# Patient Record
Sex: Female | Born: 2000 | Hispanic: No | Marital: Single | Smoking: Never smoker
Health system: Southern US, Community
[De-identification: ages and names within clinical notes are randomized; demographics above are authoritative.]

## PROBLEM LIST (undated history)

## (undated) DIAGNOSIS — E559 Vitamin D deficiency, unspecified: Secondary | ICD-10-CM

## (undated) HISTORY — DX: Vitamin D deficiency, unspecified: E55.9

## (undated) HISTORY — PX: MOUTH SURGERY: SHX715

---
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
## 2018-05-31 ENCOUNTER — Ambulatory Visit: Payer: Self-pay | Admitting: *Deleted

## 2018-05-31 NOTE — Telephone Encounter (Signed)
Contacted pt's mother, Dewayne Hatch, regarding her symptoms; she states that she is at work and the pt is at home in bed; Dewayne Hatch states that the pt started feeling nauseous the evening of 05/30/2018; emesis started about 2230 with last episode on 05/31/2018; the pt also had diarrhea which started 05/31/2018 at 0730 (at least 3 episodes); her temp was 102.4 at 0700 (taken with oral digital thermometer); the pt's mother states that her 20 year old sister was sick with the same symptoms yesterday morning but her symptoms have resolved (she only has a fever of 100 now); recommendations made per nurse triage protocol; the pt's mother would like to try recommendations but will call back in the morning to schedule an appointment if the pt is not better; the pt is scheduled to see Maurice Small, [facility], as a new pt on 06/11/2018; will route to office for notification of this encounter.         Reason for Disposition . MILD or MODERATE vomiting (e.g., 1 - 5 times / day)  Answer Assessment - Initial Assessment Questions 1. VOMITING SEVERITY: "How many times have you vomited in the past 24 hours?"     - MILD:  1 - 2 times/day    - MODERATE: 3 - 5 times/day, decreased oral intake without significant weight loss or symptoms of dehydration    - SEVERE: 6 or more times/day, vomits everything or nearly everything, with significant weight loss, symptoms of dehydration      5 times (every hour from 2230 to 0330 2. ONSET: "When did the vomiting begin?"      05/30/2018 at 1030 3. FLUIDS: "What fluids or food have you vomited up today?" "Have you been able to keep any fluids down?"     Mother is not sure 4. ABDOMINAL PAIN: "Are your having any abdominal pain?" If yes : "How bad is it and what does it feel like?" (e.g., crampy, dull, intermittent, constant)      Abdominal cramps 5. DIARRHEA: "Is there any diarrhea?" If so, ask: "How many times today?"      Yes 3 epsisodes 6. CONTACTS: "Is there anyone else in the family with the same  symptoms?"      Yes; pt's sister was also sick on 03/31/2019 (N/V. Temp 101) 7. CAUSE: "What do you think is causing your vomiting?"      8. HYDRATION STATUS: "Any signs of dehydration?" (e.g., dry mouth [not only dry lips], too weak to stand) "When did you last urinate?"     flu 9. OTHER SYMPTOMS: "Do you have any other symptoms?" (e.g., fever, headache, vertigo, vomiting blood or coffee grounds, recent head injury)     Fever 102.4 05/31/2018 at 0700; headache 10. PREGNANCY: "Is there any chance you are pregnant?" "When was your last menstrual period?"       No LMP 05/19/2018  Protocols used: [facility]

## 2018-06-02 ENCOUNTER — Ambulatory Visit: Payer: Self-pay | Admitting: Family Medicine

## 2018-06-11 ENCOUNTER — Ambulatory Visit: Payer: Self-pay | Admitting: Family Medicine

## 2018-06-22 ENCOUNTER — Encounter: Payer: Self-pay | Admitting: Family Medicine

## 2018-06-22 ENCOUNTER — Ambulatory Visit: Payer: Self-pay | Admitting: Family Medicine

## 2018-06-22 ENCOUNTER — Other Ambulatory Visit: Payer: Self-pay

## 2018-06-22 VITALS — BP 130/70 | HR 77 | Temp 98.0°F | Resp 18 | Ht 73.0 in | Wt >= 6400 oz

## 2018-06-22 DIAGNOSIS — Z803 Family history of malignant neoplasm of breast: Secondary | ICD-10-CM

## 2018-06-22 DIAGNOSIS — N898 Other specified noninflammatory disorders of vagina: Secondary | ICD-10-CM

## 2018-06-22 DIAGNOSIS — N946 Dysmenorrhea, unspecified: Secondary | ICD-10-CM

## 2018-06-22 DIAGNOSIS — Z113 Encounter for screening for infections with a predominantly sexual mode of transmission: Secondary | ICD-10-CM

## 2018-06-22 DIAGNOSIS — Z30011 Encounter for initial prescription of contraceptive pills: Secondary | ICD-10-CM

## 2018-06-22 MED ORDER — NORGESTIM-ETH ESTRAD TRIPHASIC 0.18/0.215/0.25 MG-25 MCG PO TABS: 1.0000 | ORAL_TABLET | Freq: Every day | ORAL | 3 refills | Status: DC

## 2018-06-22 NOTE — Patient Instructions (Signed)
Start your birth control pack on the 7th day of your period or the Sunday after your period starts.  Take Aleve 220mg  twice daily 2 days before starting your period, and for at least 2-3 days during your period.

## 2018-06-22 NOTE — Progress Notes (Signed)
Name: Patricia Dickerson   MRN: [id number]    DOB: 07-Dec-2000   Date:06/22/2018       Progress Note  Subjective  Chief Complaint  Chief Complaint  Patient presents with  . Establish Care  . Contraception    HPI  Pt presents to establish care and for the following:  Contraception/Dysmenorrhea: she has heavy periods, periods are very heavy x2 days, heavy cramping.  She is concerned about avoiding weight gain. She does endorse foul/metallic smelling vaginal odor for about a year.  She denies abdominal pain, NVD.  She is morbidly obese but denies hx prediabetes - denies polyphagia, polydipsia, polyuria, dysuria.   Obesity: She has been to a nutritionist in the past and still skips breakfast.  She sleeps very late and doesn't want to eat. Doesn't eat sweets, does drink soda and Patricia tea.  Struggles with portions. Denies depression or anxiety history.  She has trouble with binge eating pasta.  She is not exercising.   There are no active problems to display for this patient.   Past Surgical History:  Procedure Laterality Date  . MOUTH SURGERY      Family History  Problem Relation Age of Onset  . Thyroid disease Mother   . Diabetes Father     Social History   Socioeconomic History  . Marital status: Single    Spouse name: Not on file  . Number of children: 0  . Years of education: Not on file  . Highest education level: Not on file  Occupational History  . Not on file  Social Needs  . Financial resource strain: Not hard at all  . Food insecurity:    Worry: Never true    Inability: Never true  . Transportation needs:    Medical: No    Non-medical: No  Tobacco Use  . Smoking status: Never Smoker  . Smokeless tobacco: Never Used  Substance and Sexual Activity  . Alcohol use: Never    Frequency: Never  . Drug use: Never  . Sexual activity: Never    Partners: Male  Lifestyle  . Physical activity:    Days per week: 0 days    Minutes per session: 0 min  . Stress:  Not on file  Relationships  . Social connections:    Talks on phone: More than three times a week    Gets together: More than three times a week    Attends religious service: Never    Active member of club or organization: No    Attends meetings of clubs or organizations: Never    Relationship status: Never married  . Intimate partner violence:    Fear of current or ex partner: No    Emotionally abused: Not on file    Physically abused: No    Forced sexual activity: No  Other Topics Concern  . Not on file  Social History Narrative  . Not on file     Current Outpatient Medications:  .  Ascorbic Acid (VITAMIN C) 100 MG tablet, Take 100 mg by mouth daily., Disp: , Rfl:  .  omeprazole (PRILOSEC) 20 MG capsule, Take by mouth., Disp: , Rfl:  .  Norgestimate-Ethinyl Estradiol Triphasic (ORTHO TRI-CYCLEN LO) 0.18/0.215/0.25 MG-25 MCG tab, Take 1 tablet by mouth daily., Disp: 3 Package, Rfl: 3  Allergies  Allergen Reactions  . Amoxicillin Hives and Rash    I personally reviewed active problem list, medication list, allergies, family history, social history, health maintenance, notes from last encounter,  lab results with the patient/caregiver today.   ROS  Ten systems reviewed and is negative except as mentioned in HPI  Objective  Vitals:   06/22/18 1328  BP: 130/70  Pulse: 77  Resp: 18  Temp: 98 F (36.7 C)  TempSrc: Oral  SpO2: 97%  Weight: (!) 451 lb 11.2 oz (204.9 kg)  Height: 6\' 1"  (1.854 m)    Body mass index is 59.59 kg/m.  Physical Exam  Constitutional: Patient appears well-developed and well-nourished. No distress.  HENT: Head: Normocephalic and atraumatic.  Cardiovascular: Normal rate, regular rhythm and normal heart sounds.  No murmur heard. No BLE edema. Pulmonary/Chest: Effort normal and breath sounds normal. No respiratory distress. Breast: no lumps or masses, no nipple discharge or rashes FEMALE GENITALIA: Pt deferred. Musculoskeletal: Normal range  of motion, no joint effusions. No gross deformities Neurological: he is alert and oriented to person, place, and time. No cranial nerve deficit. Coordination, balance, strength, speech and gait are normal.  Skin: Skin is warm and dry. No rash noted. No erythema.  Psychiatric: Patient has a normal mood and affect. behavior is normal. Judgment and thought content normal.  No results found for this or any previous visit (from the past 72 hour(s)).   PHQ2/9: Depression screen PHQ 2/9 06/22/2018  Decreased Interest 0  Down, Depressed, Hopeless 0  PHQ - 2 Score 0  Altered sleeping 0  Tired, decreased energy 0  Change in appetite 0  Feeling bad or failure about yourself  0  Trouble concentrating 0  Moving slowly or fidgety/restless 0  Suicidal thoughts 0  PHQ-9 Score 0  Difficult doing work/chores Not difficult at all   Fall Risk: Fall Risk  06/22/2018  Falls in the past year? 1  Number falls in past yr: 0  Injury with Fall? 1  Follow up Falls evaluation completed    Assessment & Plan  1. Dysmenorrhea in adolescent - Norgestimate-Ethinyl Estradiol Triphasic (ORTHO TRI-CYCLEN LO) 0.18/0.215/0.25 MG-25 MCG tab; Take 1 tablet by mouth daily.  Dispense: 3 Package; Refill: 3 - CBC w/Diff/Platelet  2. Family history of breast cancer - No concerns today  3. Vaginal odor - Patient refuses vaginal swab after history is taken. Advised to return if she would like evaluation. - Cervicovaginal ancillary only  4. Morbid obesity (HCC) - Discussed importance of 150 minutes of physical activity weekly, eat two servings of fish weekly, eat one serving of tree nuts ( cashews, pistachios, pecans, almonds.Marland Kitchen) every other day, eat 6 servings of fruit/vegetables daily and drink plenty of water and avoid Patricia beverages. ' - COMPLETE METABOLIC PANEL WITH GFR - Hemoglobin A1c  5. Oral contraception initial prescription - Norgestimate-Ethinyl Estradiol Triphasic (ORTHO TRI-CYCLEN LO) 0.18/0.215/0.25  MG-25 MCG tab; Take 1 tablet by mouth daily.  Dispense: 3 Package; Refill: 3  6. Routine screening for STI (sexually transmitted infection) - Cervicovaginal ancillary only - HIV Antibody (routine testing w rflx) - RPR

## 2018-06-23 LAB — CBC WITH DIFFERENTIAL/PLATELET
Absolute Monocytes: 533 cells/uL (ref 200–950)
Basophils Absolute: 41 cells/uL (ref 0–200)
Basophils Relative: 0.5 %
Eosinophils Absolute: 74 cells/uL (ref 15–500)
Eosinophils Relative: 0.9 %
HCT: 43 % (ref 35.0–45.0)
Hemoglobin: 14.3 g/dL (ref 11.7–15.5)
Lymphs Abs: 2960 cells/uL (ref 850–3900)
MCH: 29.3 pg (ref 27.0–33.0)
MCHC: 33.3 g/dL (ref 32.0–36.0)
MCV: 88.1 fL (ref 80.0–100.0)
MPV: 11.2 fL (ref 7.5–12.5)
Monocytes Relative: 6.5 %
NEUTROS PCT: 56 %
Neutro Abs: 4592 cells/uL (ref 1500–7800)
Platelets: 379 10*3/uL (ref 140–400)
RBC: 4.88 10*6/uL (ref 3.80–5.10)
RDW: 12.2 % (ref 11.0–15.0)
Total Lymphocyte: 36.1 %
WBC: 8.2 10*3/uL (ref 3.8–10.8)

## 2018-06-23 LAB — HEMOGLOBIN A1C
Hgb A1c MFr Bld: 5.5 % of total Hgb (ref <5.7)
Mean Plasma Glucose: 111 (calc)
eAG (mmol/L): 6.2 (calc)

## 2018-06-23 LAB — COMPLETE METABOLIC PANEL WITH GFR
AG Ratio: 1.6 (calc) (ref 1.0–2.5)
ALT: 15 U/L (ref 5–32)
AST: 14 U/L (ref 12–32)
Albumin: 4.3 g/dL (ref 3.6–5.1)
Alkaline phosphatase (APISO): 53 U/L (ref 47–176)
BUN: 13 mg/dL (ref 7–20)
CALCIUM: 9.6 mg/dL (ref 8.9–10.4)
CO2: 24 mmol/L (ref 20–32)
Chloride: 106 mmol/L (ref 98–110)
Creat: 0.79 mg/dL (ref 0.50–1.00)
GFR, Est African American: 126 mL/min/{1.73_m2} (ref >=60)
GFR, Est Non African American: 109 mL/min/{1.73_m2} (ref >=60)
Globulin: 2.7 g/dL (calc) (ref 2.0–3.8)
Glucose, Bld: 87 mg/dL (ref 65–99)
Potassium: 4.5 mmol/L (ref 3.8–5.1)
Sodium: 139 mmol/L (ref 135–146)
Total Bilirubin: 0.6 mg/dL (ref 0.2–1.1)
Total Protein: 7 g/dL (ref 6.3–8.2)

## 2018-06-23 LAB — RPR: RPR Ser Ql: NONREACTIVE

## 2018-06-23 LAB — HIV ANTIBODY (ROUTINE TESTING W REFLEX): HIV 1&2 Ab, 4th Generation: NONREACTIVE

## 2018-07-01 ENCOUNTER — Encounter: Payer: Self-pay | Admitting: Family Medicine

## 2018-07-02 ENCOUNTER — Telehealth: Payer: Self-pay | Admitting: Emergency Medicine

## 2018-07-02 MED ORDER — NALTREXONE-BUPROPION HCL ER 8-90 MG PO TB12: ORAL_TABLET | ORAL | 2 refills | Status: DC

## 2018-07-02 NOTE — Telephone Encounter (Signed)
Called and spoke to mom who is on the DPR about daughters bill explaining that we never know how much a selfpay bill will be until after the visit and it gets billed. Mom was upset and only states that "it is what it is".

## 2018-07-02 NOTE — Telephone Encounter (Signed)
Copied from CRM [id number]. Topic: General - Inquiry >> Jul 02, 2018  8:24 AM Windy Kalata, NT wrote: Reason for CRM: patient mother is calling and states that on the new patient visit she paid $50 upfront and was told that the total would be around $80. She states that they received a bill for $180 and she is wanting to know why she was charged so much when it was a new patient appointment and all they did was talk. Please advise

## 2018-07-06 ENCOUNTER — Encounter: Payer: Self-pay | Admitting: Family Medicine

## 2018-07-26 ENCOUNTER — Other Ambulatory Visit: Payer: Self-pay | Admitting: Family Medicine

## 2018-07-26 NOTE — Telephone Encounter (Signed)
Copied from CRM [id number]. Topic: Quick Communication - Rx Refill/Question >> Jul 26, 2018  4:12 PM Jaquita Rector A wrote: Medication: Naltrexone-buPROPion HCl ER 8-90 MG TB12   Rx never received originally sent to wrong pharmacy  Has the patient contacted their pharmacy? Yes.   (Agent: If no, request that the patient contact the pharmacy for the refill.) (Agent: If yes, when and what did the pharmacy advise?)  Preferred Pharmacy (with phone number or street name): [facility] [address] - [place], [place] - [address] [phone] (Phone) [phone] (Fax)    Agent: Please be advised that RX refills may take up to 3 business days. We ask that you follow-up with your pharmacy.

## 2018-07-27 NOTE — Telephone Encounter (Signed)
Refill is too soon

## 2018-07-27 NOTE — Telephone Encounter (Signed)
Refill request for general medication: Naltrexone-Bupropion HCI Er 8-90 mg  Last office visit: 06/22/2018  Last physical exam: None indicated  Follow-ups on file. 10/21/2018

## 2018-10-04 ENCOUNTER — Ambulatory Visit (INDEPENDENT_AMBULATORY_CARE_PROVIDER_SITE_OTHER): Payer: Medicaid Other | Admitting: Family Medicine

## 2018-10-04 ENCOUNTER — Encounter: Payer: Self-pay | Admitting: Family Medicine

## 2018-10-04 ENCOUNTER — Other Ambulatory Visit: Payer: Self-pay

## 2018-10-04 DIAGNOSIS — F419 Anxiety disorder, unspecified: Secondary | ICD-10-CM | POA: Insufficient documentation

## 2018-10-04 DIAGNOSIS — Z68.41 Body mass index (BMI) pediatric, greater than or equal to 95th percentile for age: Secondary | ICD-10-CM | POA: Diagnosis not present

## 2018-10-04 DIAGNOSIS — R51 Headache: Secondary | ICD-10-CM

## 2018-10-04 DIAGNOSIS — R519 Headache, unspecified: Secondary | ICD-10-CM

## 2018-10-04 DIAGNOSIS — N946 Dysmenorrhea, unspecified: Secondary | ICD-10-CM | POA: Diagnosis not present

## 2018-10-04 MED ORDER — BUSPIRONE HCL 7.5 MG PO TABS: ORAL_TABLET | ORAL | 1 refills | Status: AC

## 2018-10-04 NOTE — Patient Instructions (Signed)
Headache Tips:  Keep a headache log: Record the date and time your headaches start and end, the severity of your pain (0-10), any possible triggers, what treatments you tried, and if any of those treatments worked.  Common Headache Triggers: Certain food additives, alcohol, artificial sweeteners (like aspartame), caffeine (overuse and when you stop suddenly), delayed or skipped meals, exercise, certain foods (chocolate, soft cheeses, red wine), bright lights, loud noises, menses, certain odors, oral contraceptives, depression and anxiety, sleep issues, smoke inhalation (smoking or second-hand smoke), stress, weather changes  Supplements known to reduce headaches and migraines are: Riboflavin - Vitamin B2 - goal is 200mg/day twice per day with food Magnesium - goal is 200mg twice per day with food OR MigreLief - 1 tablet twice per day with food. This is a combination of the Riboflavin and Magnesium. It is only available at some health food stores and from [employer].   Please work to reduce your stress by taking at least 30 minutes to exercise, stretch, and/or meditate every day.  Drink plenty of water throughout the day, do not skip meals, and eat healthy foods that are not fried, fatty, high in sugar, or processed.  

## 2018-10-04 NOTE — Progress Notes (Signed)
Name: Patricia Dickerson   MRN: [id number]    DOB: 2000-06-01   Date:10/04/2018       Progress Note  Subjective  Chief Complaint  Chief Complaint  Patient presents with  . Follow-up  . Anxiety  . Pressure Behind the Eyes    for 1 week    I connected with  [name]  on 10/04/18 at  1:00 PM EDT by a video enabled telemedicine application and verified that I am speaking with the correct person using two identifiers.  I discussed the limitations of evaluation and management by telemedicine and the availability of in person appointments. The patient expressed understanding and agreed to proceed. Staff also discussed with the patient that there may be a patient responsible charge related to this service. Patient Location: Home Provider Location: Home Additional Individuals present: Mother  HPI  Contraception/Dysmenorrhea: she has heavy periods, periods are very heavy x2 days, heavy cramping. Had discussed risk of clotting due to obesity when taking OCP's at last visit and she agreed to start OCP, however she never did start the medication.  At this time, we will hold off on initiation as she has been having new onset headaches.  Obesity: She has been to a nutritionist in the past and still skips breakfast.  She sleeps very late and doesn't want to eat. Doesn't eat sweets, does drink soda and sweet tea.  Struggles with portions. Denies depression or anxiety history.  She has trouble with binge eating pasta.  She is not exercising. Contrave was denied - we will look into appeals process.   Headaches: She rarely has headaches, lately she has been getting posterior headache and some pressure behind the eyes.  Denies vision changes, did have eye appt in February 2020, but did have pressure test done and this was normal. Denies n/v, light/sound sensitivity.  She has been dealing with more stress and anxiety lately- headache started at the same time as her anxiety. She is drinking sodas (about 1  a day) and is eating less than usual.  She has been taking ibuprofen when she absolutely needs to,   Anxiety: She has taken an antidepressant in the past, after looking at the SE's she decided she didn't want to take the medication on a daily basis.  She does not want a daily medication, but something more as needed and had been recommended a benzodiazepine and would like something like this again.  She does get panic attacks - occurring approximately once a week.  She is not working right now, having difficulty sleeping, recently went through a break up, not being about to leave the house. We will trial buspar by itself today.      [facility] Visit from 10/04/2018 in [facility]  PHQ-9 Total Score  4     GAD 7 : Generalized Anxiety Score 10/04/2018  Nervous, Anxious, on Edge 2  Control/stop worrying 0  Worry too much - different things 2  Trouble relaxing 0  Restless 0  Easily annoyed or irritable 0  Afraid - awful might happen 0  Total GAD 7 Score 4  Anxiety Difficulty Somewhat difficult   Patient Active Problem List   Diagnosis Date Noted  . Severe obesity due to excess calories without serious comorbidity with body mass index (BMI) greater than 99th percentile for age in pediatric patient (HCC) 11/10/2016  . Headache in front of head 01/12/2014  . Gastroesophageal reflux 12/26/2013    Past Surgical History:  Procedure  Laterality Date  . MOUTH SURGERY      Family History  Problem Relation Age of Onset  . Thyroid disease Mother   . Diabetes Father     Social History   Socioeconomic History  . Marital status: Single    Spouse name: Not on file  . Number of children: 0  . Years of education: Not on file  . Highest education level: Not on file  Occupational History  . Not on file  Social Needs  . Financial resource strain: Not hard at all  . Food insecurity:    Worry: Never true    Inability: Never true  . Transportation needs:    Medical: No     Non-medical: No  Tobacco Use  . Smoking status: Never Smoker  . Smokeless tobacco: Never Used  Substance and Sexual Activity  . Alcohol use: Never    Frequency: Never  . Drug use: Never  . Sexual activity: Never    Partners: Male  Lifestyle  . Physical activity:    Days per week: 0 days    Minutes per session: 0 min  . Stress: Not on file  Relationships  . Social connections:    Talks on phone: More than three times a week    Gets together: More than three times a week    Attends religious service: Never    Active member of club or organization: No    Attends meetings of clubs or organizations: Never    Relationship status: Never married  . Intimate partner violence:    Fear of current or ex partner: No    Emotionally abused: Not on file    Physically abused: No    Forced sexual activity: No  Other Topics Concern  . Not on file  Social History Narrative  . Not on file     Current Outpatient Medications:  .  Ascorbic Acid (VITAMIN C) 100 MG tablet, Take 100 mg by mouth daily., Disp: , Rfl:  .  Norgestimate-Ethinyl Estradiol Triphasic (ORTHO TRI-CYCLEN LO) 0.18/0.215/0.25 MG-25 MCG tab, Take 1 tablet by mouth daily., Disp: 3 Package, Rfl: 3 .  omeprazole (PRILOSEC) 20 MG capsule, Take by mouth., Disp: , Rfl:  .  Naltrexone-buPROPion HCl ER 8-90 MG TB12, 1 tablet each morning x7 days, then 1 tablet twice daily x7 days, then 2 tablets each morning and 1 tablet each evening x7 days, then maintain at 2 tablets twice daily. (Patient not taking: Reported on 10/04/2018), Disp: 120 tablet, Rfl: 2  Allergies  Allergen Reactions  . Amoxicillin Hives and Rash    I personally reviewed active problem list, medication list, allergies, family history, notes from last encounter, lab results with the patient/caregiver today.   ROS Constitutional: Negative for fever or weight change.  Respiratory: Negative for cough and shortness of breath.   Cardiovascular: Negative for chest pain or  palpitations.  Gastrointestinal: Negative for abdominal pain, no bowel changes.  Musculoskeletal: Negative for gait problem or joint swelling.  Skin: Negative for rash.  Neurological: Negative for dizziness; positive for headache.  No other specific complaints in a complete review of systems (except as listed in HPI above).   Objective  Virtual encounter, vitals not obtained.  There is no height or weight on file to calculate BMI.  Physical Exam Constitutional: Patient appears well-developed and well-nourished. No distress.  HENT: Head: Normocephalic and atraumatic.  Neck: Normal range of motion. Pulmonary/Chest: Effort normal. No respiratory distress. Speaking in complete sentences Neurological: Pt is alert  and oriented to person, place, and time. Coordination, speech and gait are normal.  Psychiatric: Patient has a normal mood and affect. behavior is normal. Judgment and thought content normal.    No results found for this or any previous visit (from the past 72 hour(s)).  PHQ2/9: Depression screen [facility] 10/04/2018 06/22/2018  Decreased Interest 0 0  Down, Depressed, Hopeless 0 0  PHQ - 2 Score 0 0  Altered sleeping 3 0  Tired, decreased energy 1 0  Change in appetite 0 0  Feeling bad or failure about yourself  0 0  Trouble concentrating 0 0  Moving slowly or fidgety/restless 0 0  Suicidal thoughts 0 0  PHQ-9 Score 4 0  Difficult doing work/chores Not difficult at all Not difficult at all   PHQ-2/9 Result is positive.    Fall Risk: Fall Risk  10/04/2018 06/22/2018  Falls in the past year? 0 1  Number falls in past yr: 0 0  Injury with Fall? 0 1  Follow up Falls evaluation completed Falls evaluation completed    Assessment & Plan  1. Anxiety - Discussed stress reduction - busPIRone (BUSPAR) 7.5 MG tablet; Take 1 tablet once daily at night, may take 1 add'l tab during the day as needed for anxiety.  Dispense: 45 tablet; Refill: 1  2. Headache in front of head -  Discussed headache diet, keeping a log of headaches, and stress reduction.  Will recheck in 4 weeks.  3. Severe obesity due to excess calories without serious comorbidity with body mass index (BMI) greater than 99th percentile for age in pediatric patient [facility](HCC) - Discussed importance of 150 minutes of physical activity weekly, eat two servings of fish weekly, eat one serving of tree nuts ( cashews, pistachios, pecans, almonds.Marland Kitchen.) every other day, eat 6 servings of fruit/vegetables daily and drink plenty of water and avoid sweet beverages.   4. Dysmenorrhea in adolescent - Do not take OCP - she never started and is now having new onset headaches, so we will hold off on initiation at this time.   I discussed the assessment and treatment plan with the patient. The patient was provided an opportunity to ask questions and all were answered. The patient agreed with the plan and demonstrated an understanding of the instructions.  The patient was advised to call back or seek an in-person evaluation if the symptoms worsen or if the condition fails to improve as anticipated.  I provided 26 minutes of non-face-to-face time during this encounter.

## 2018-10-14 ENCOUNTER — Ambulatory Visit (INDEPENDENT_AMBULATORY_CARE_PROVIDER_SITE_OTHER): Payer: Medicaid Other | Admitting: Family Medicine

## 2018-10-14 ENCOUNTER — Other Ambulatory Visit: Payer: Self-pay

## 2018-10-14 ENCOUNTER — Encounter: Payer: Self-pay | Admitting: Family Medicine

## 2018-10-14 VITALS — BP 118/74 | HR 95 | Temp 99.0°F | Resp 12 | Ht 72.0 in | Wt >= 6400 oz

## 2018-10-14 DIAGNOSIS — H6981 Other specified disorders of Eustachian tube, right ear: Secondary | ICD-10-CM | POA: Diagnosis not present

## 2018-10-14 DIAGNOSIS — H6121 Impacted cerumen, right ear: Secondary | ICD-10-CM | POA: Diagnosis not present

## 2018-10-14 MED ORDER — FLUTICASONE PROPIONATE 50 MCG/ACT NA SUSP: 2.0000 | Freq: Every day | NASAL | 6 refills | Status: DC

## 2018-10-14 MED ORDER — CARBAMIDE PEROXIDE 6.5 % OT SOLN: 5.0000 [drp] | Freq: Two times a day (BID) | OTIC | 3 refills | Status: DC | PRN

## 2018-10-14 NOTE — Progress Notes (Signed)
Name: Patricia Dickerson   MRN: [id number]    DOB: 08/08/2000   Date:10/14/2018       Progress Note  Subjective  Chief Complaint  Chief Complaint  Patient presents with  . Ear Problem    right ear is muffled no other symptoms    HPI  With concern for RIGHT ear feeling muffled.  She has struggled with this in the past, has had tubes in the past.  Symptom onset 2 weeks ago, and is improving on its own. No fevers, chills, otalgia, nasal congestion, cough, chest tightness, or shortness of breath, no loss of taste or smell, no tinnitus.  Patient Active Problem List   Diagnosis Date Noted  . Anxiety 10/04/2018  . Severe obesity due to excess calories without serious comorbidity with body mass index (BMI) greater than 99th percentile for age in pediatric patient (HCC) 11/10/2016  . Headache in front of head 01/12/2014  . Gastroesophageal reflux 12/26/2013    Social History   Tobacco Use  . Smoking status: Never Smoker  . Smokeless tobacco: Never Used  Substance Use Topics  . Alcohol use: Never    Frequency: Never     Current Outpatient Medications:  .  Ascorbic Acid (VITAMIN C) 100 MG tablet, Take 100 mg by mouth daily., Disp: , Rfl:  .  busPIRone (BUSPAR) 7.5 MG tablet, Take 1 tablet once daily at night, may take 1 add'l tab during the day as needed for anxiety., Disp: 45 tablet, Rfl: 1 .  omeprazole (PRILOSEC) 20 MG capsule, Take by mouth., Disp: , Rfl:   Allergies  Allergen Reactions  . Amoxicillin Hives and Rash    I personally reviewed active problem list, medication list, allergies, lab results with the patient/caregiver today.  ROS  Ten systems reviewed and is negative except as mentioned in HPI.  Objective  Vitals:   10/14/18 1013  BP: 118/74  Pulse: 95  Resp: 12  Temp: 99 F (37.2 C)  TempSrc: Oral  SpO2: 96%  Weight: (!) 447 lb 9.6 oz (203 kg)  Height: 6' (1.829 m)   Body mass index is 60.71 kg/m.  Nursing Note and Vital Signs reviewed.   Physical Exam  Constitutional: Patient appears well-developed and well-nourished. Obese. No distress.  HEENT: head atraumatic, normocephalic, pupils equal and reactive to light, Bilateral TM's without erythema or effusion - there is small amount of cerumen in the RIGHT canal without impaction, bilataeral TM's with some scarring (states hx tubes),  bilateral maxillary and frontal sinuses are non-tender, neck supple without lymphadenopathy, throat within normal limits - no erythema or exudate, no tonsillar swelling Cardiovascular: Normal rate, regular rhythm and normal heart sounds.  No murmur heard. No BLE edema. Pulmonary/Chest: Effort normal and breath sounds clear bilaterally. No respiratory distress. Psychiatric: Patient has a normal mood and affect. behavior is normal. Judgment and thought content normal.  No results found for this or any previous visit (from the past 72 hour(s)).  Assessment & Plan  1. Cerumen debris on tympanic membrane of right ear - carbamide peroxide (DEBROX) 6.5 % OTIC solution; Place 5 drops into both ears 2 (two) times daily as needed.  Dispense: 15 mL; Refill: 3  2. Dysfunction of right eustachian tube - fluticasone (FLONASE) 50 MCG/ACT nasal spray; Place 2 sprays into both nostrils daily.  Dispense: 16 g; Refill: 6  -Red flags and when to present for emergency care or RTC including fever >101.72F, chest pain, shortness of breath, new/worsening/un-resolving symptoms, reviewed with patient at  time of visit. Follow up and care instructions discussed and provided in AVS.

## 2018-10-21 ENCOUNTER — Encounter: Payer: Medicaid Other | Admitting: Family Medicine

## 2018-10-21 ENCOUNTER — Ambulatory Visit: Payer: Self-pay | Admitting: *Deleted

## 2018-10-21 NOTE — Telephone Encounter (Signed)
Spoke with mother directly, advised to follow RN guidance as below.  Check temperatures daily.  Strongly recommended they start following the guidelines set forth by CDC regarding social distancing and masking, do their best to self-isolate over the next 14 days and monitor for symptoms.

## 2018-10-21 NOTE — Telephone Encounter (Signed)
Pt's mother, Patricia Dickerson, called stating that the pt had been exposed to a friend whose parents tested positive for COVID on 10/18/2018; the family visited [place] and [place], [place]; 10/15/2018 - 10/18/2018; she would like to know what to do, and asks about testing; recommendations made per nurse triage protocol; pt's mother transferred to [facility] at [facility] for conversation with provider.  Reason for Disposition . [1] COVID-19 EXPOSURE (Close Contact) within last 14 days AND [2] NO cough, fever, or breathing difficulty  Answer Assessment - Initial Assessment Questions 1. CLOSE CONTACT: "Who is the person with the confirmed or suspected COVID-19 infection that you were exposed to?"     Pt's friend 2. PLACE of CONTACT: "Where were you when you were exposed to COVID-19?" (e.g., home, school, medical waiting room; which city?)     home 3. TYPE of CONTACT: "How much contact was there?" (e.g., sitting next to, live in same house, work in same office, same building)   Home and shopping 4. DURATION of CONTACT: "How long were you in contact with the COVID-19 patient?" (e.g., a few seconds, passed by person, a few minutes, live with the patient)     hours 5. DATE of CONTACT: "When did you have contact with a COVID-19 patient?" (e.g., how many days ago)     Oct 18, 2018 6. TRAVEL: "Have you traveled out of the country recently?" If so, "When and where?"     * Also ask about out-of-state travel, since the CDC has identified some high risk cities for community spread in the [place].     * Note: Travel becomes less relevant if there is widespread community transmission where the patient lives.     Yes, [place] 10/15/18-10/18/18 7. COMMUNITY SPREAD: "Are there lots of cases of COVID-19 (community spread) where you live?" (See public health department website, if unsure)   * MAJOR community spread: high number of cases; numbers of cases are increasing; many people hospitalized.   * MINOR community spread:  low number of cases; not increasing; few or no people hospitalized     major 8. SYMPTOMS: "Do you have any symptoms?" (e.g., fever, cough, breathing difficulty)    no 9. PREGNANCY OR POSTPARTUM: "Is there any chance you are pregnant?" "When was your last menstrual period?" "Did you deliver in the last 2 weeks?"    No; LMP 10/15/18 10. HIGH RISK: "Do you have any heart or lung problems? Do you have a weak immune system?" (e.g., CHF, COPD, asthma, HIV positive, chemotherapy, renal failure, diabetes mellitus, sickle cell anemia)     no  Protocols used: CORONAVIRUS (COVID-19) EXPOSURE-A-AH

## 2018-10-21 NOTE — Telephone Encounter (Signed)
FYI

## 2018-11-02 ENCOUNTER — Ambulatory Visit: Payer: Medicaid Other | Admitting: Family Medicine

## 2018-11-04 ENCOUNTER — Encounter: Payer: Self-pay | Admitting: Family Medicine

## 2018-11-04 ENCOUNTER — Other Ambulatory Visit: Payer: Self-pay

## 2018-11-04 ENCOUNTER — Ambulatory Visit (INDEPENDENT_AMBULATORY_CARE_PROVIDER_SITE_OTHER): Payer: Medicaid Other | Admitting: Family Medicine

## 2018-11-04 VITALS — BP 118/74 | HR 100 | Resp 16 | Ht 72.0 in | Wt >= 6400 oz

## 2018-11-04 DIAGNOSIS — Z0000 Encounter for general adult medical examination without abnormal findings: Secondary | ICD-10-CM | POA: Diagnosis not present

## 2018-11-04 NOTE — Progress Notes (Signed)
Adolescent Well Care Visit Patricia Dickerson is a 18 y.o. female who is here for well care.     PCP:  Doren CustardBoyce, Emily E, FNP   History was provided by the patient.  Confidentiality was discussed with the patient and, if applicable, with caregiver as well. Patient's personal or confidential phone number: On file    Current Issues: Current concerns include No concerns   Nutrition: Nutrition/Eating Behaviors: Does like to eat pasta, eats a lot of vegetables but not as much fruit, eats a lot of chicken - baked and grilled mostly. Adequate calcium in diet?: Does not get much in her diet - encouraged her to increase this. Supplements/ Vitamins: Taking vitamin C.   Exercise/ Media: Play any Sports?:  None Exercise:  none and not active Screen Time:  > 2 hours-counseling provided Media Rules or Monitoring?: no  Sleep:  Sleep: Getting about 7-8 hours a night  Social Screening: Lives with:  Mom, and two younger siblings Parental relations:  good Activities, Work, and Regulatory affairs officerChores?: She is working at [employer].  Has responsibilities at home. No activity involvement. Concerns regarding behavior with peers?  no Stressors of note: no  Education: School Name: Planning to attend [employer] for the fall. Hoping to study nursing School Grade: N/A - finished high school. School performance: No learning disabilities  Menstruation:   Patient's last menstrual period was 10/16/2018 (exact date). Menstrual History: Has ongoing heavy menses.  She notes they are irregular; used to track but stopped a while ago.  She was given OCP a few months ago, but she never took it.   Patient has a dental home: no - due to recent move  Confidential social history: Tobacco?  no Secondhand smoke exposure?  no Drugs/ETOH?  no  Sexually Active?  no - has never been sexually active; ID's as heterosexual. Pregnancy Prevention: Declines OCP, discussed condom use.   Safe at home, in school & in relationships?  Yes Safe to  self?  Yes   PHQ-9 completed and results indicated.   [facility]  PHQ-9 Total Score  0     Physical Exam:  Vitals:   11/04/18 1006  BP: 118/74  Pulse: 100  Resp: 16  SpO2: 99%  Weight: (!) 452 lb 11.2 oz (205.3 kg)  Height: 6' (1.829 m)   BP 118/74 (BP Location: Left Arm, Patient Position: Sitting, Cuff Size: Large)   Pulse 100   Resp 16   Ht 6' (1.829 m)   Wt (!) 452 lb 11.2 oz (205.3 kg)   LMP 10/16/2018 (Exact Date)   SpO2 99%   BMI 61.40 kg/m  Body mass index: body mass index is 61.4 kg/m. Growth percentile SmartLinks can only be used for patients less than 20 years old.  No exam data present  Physical Exam  Constitutional: Patient appears well-developed and well-nourished. No distress.  HENT: Head: Normocephalic and atraumatic. Ears: B TMs ok, no erythema or effusion; Nose: Nose normal. Mouth/Throat: Oropharynx is clear and moist. No oropharyngeal exudate.  Eyes: Conjunctivae and EOM are normal. Pupils are equal, round, and reactive to light. No scleral icterus.  Neck: Normal range of motion. Neck supple. No JVD present. No thyromegaly present.  Cardiovascular: Normal rate, regular rhythm and normal heart sounds.  No murmur heard. No BLE edema. Pulmonary/Chest: Effort normal and breath sounds normal. No respiratory distress. Abdominal: Soft. Bowel sounds are normal, no distension. There is no tenderness. no masses Breast: no lumps or  masses, no nipple discharge or rashes FEMALE GENITALIA: Deferred Musculoskeletal: Normal range of motion, no joint effusions. No gross deformities Neurological: he is alert and oriented to person, place, and time. No cranial nerve deficit. Coordination, balance, strength, speech and gait are normal.  Skin: Skin is warm and dry. No rash noted. No erythema.  Psychiatric: Patient has a normal mood and affect. behavior is normal. Judgment and thought content normal.  Assessment and Plan:    1. Well woman exam (no gynecological exam) Discussed with adolescent  and caregiver the importance of limiting screen time to no more than 2 hours per day, exercise daily for at least 2 hours, eat 6 servings of fruit and vegetables daily, eat tree nuts ( pistachios, pecans , almonds...) one serving every other day, eat fish twice weekly. Read daily. Get involved in school. Have responsibilities  at home. To avoid STI's, practice abstinence, if unable use condoms and stick with one partner.  Discussed importance of contraception if sexually active to avoid unwanted pregnancy.  BMI is not appropriate for age - Doing well, labs at last visit, declines vaginal swab.  Hubbard Hartshorn, FNP

## 2018-12-02 ENCOUNTER — Ambulatory Visit: Payer: Medicaid Other | Admitting: Family Medicine

## 2018-12-07 DIAGNOSIS — J029 Acute pharyngitis, unspecified: Secondary | ICD-10-CM | POA: Diagnosis not present

## 2018-12-07 DIAGNOSIS — Z20828 Contact with and (suspected) exposure to other viral communicable diseases: Secondary | ICD-10-CM | POA: Diagnosis not present

## 2018-12-16 ENCOUNTER — Encounter: Payer: Self-pay | Admitting: Nurse Practitioner

## 2018-12-16 ENCOUNTER — Ambulatory Visit (INDEPENDENT_AMBULATORY_CARE_PROVIDER_SITE_OTHER): Payer: Medicaid Other | Admitting: Nurse Practitioner

## 2018-12-16 DIAGNOSIS — Z8669 Personal history of other diseases of the nervous system and sense organs: Secondary | ICD-10-CM

## 2018-12-16 DIAGNOSIS — H9201 Otalgia, right ear: Secondary | ICD-10-CM | POA: Diagnosis not present

## 2018-12-16 NOTE — Progress Notes (Signed)
Virtual Visit via Video Note  I connected with Patricia Dickerson on 12/16/18 at 10:00 AM EDT by a video enabled telemedicine application and verified that I am speaking with the correct person using two identifiers.   Staff discussed the limitations of evaluation and management by telemedicine and the availability of in person appointments. The patient expressed understanding and agreed to proceed.  Patient location: home  My location: home office Other people present: mom- Loyal Jacobsonnn Pettinger HPI  Right ear has muffled noises for the past few days and has pressure in ear.   States in the past was muffled to cerumen impaction and cleared out. When to store and got syringe with the drops.  carbamide peroxide.   Denies ear pain, facial pressure, nasal congestion, itchy watery eyes. Had some sneezing last week- negative COVID testing.    PHQ2/9: [facility] 12/16/2018 11/04/2018 10/14/2018 10/04/2018 06/22/2018  Decreased Interest 0 0 0 0 0  Down, Depressed, Hopeless 0 0 0 0 0  PHQ - 2 Score 0 0 0 0 0  Altered sleeping 0 0 0 3 0  Tired, decreased energy 0 0 0 1 0  Change in appetite 0 0 0 0 0  Feeling bad or failure about yourself  0 0 0 0 0  Trouble concentrating 0 0 0 0 0  Moving slowly or fidgety/restless 0 0 0 0 0  Suicidal thoughts 0 0 0 0 0  PHQ-9 Score 0 0 0 4 0  Difficult doing work/chores Not difficult at all - Not difficult at all Not difficult at all Not difficult at all     PHQ reviewed. Negative  Patient Active Problem List   Diagnosis Date Noted  . Anxiety 10/04/2018  . Severe obesity due to excess calories without serious comorbidity with body mass index (BMI) greater than 99th percentile for age in pediatric patient (HCC) 11/10/2016  . Headache in front of head 01/12/2014  . Gastroesophageal reflux 12/26/2013    History reviewed. No pertinent past medical history.  Past Surgical History:  Procedure Laterality Date  . MOUTH SURGERY      Social History    Tobacco Use  . Smoking status: Never Smoker  . Smokeless tobacco: Never Used  Substance Use Topics  . Alcohol use: Never    Frequency: Never     Current Outpatient Medications:  .  Ascorbic Acid (VITAMIN C) 100 MG tablet, Take 100 mg by mouth daily., Disp: , Rfl:  .  busPIRone (BUSPAR) 7.5 MG tablet, Take 1 tablet once daily at night, may take 1 add'l tab during the day as needed for anxiety., Disp: 45 tablet, Rfl: 1 .  fluticasone (FLONASE) 50 MCG/ACT nasal spray, Place 2 sprays into both nostrils daily., Disp: 16 g, Rfl: 6 .  omeprazole (PRILOSEC) 20 MG capsule, Take by mouth., Disp: , Rfl:  .  carbamide peroxide (DEBROX) 6.5 % OTIC solution, Place 5 drops into both ears 2 (two) times daily as needed. (Patient not taking: Reported on 12/16/2018), Disp: 15 mL, Rfl: 3  Allergies  Allergen Reactions  . Amoxicillin Hives and Rash    ROS   No other specific complaints in a complete review of systems (except as listed in HPI above).  Objective  There were no vitals filed for this visit.  There is no height or weight on file to calculate BMI.  Nursing Note and Vital Signs reviewed.  Physical Exam   Constitutional: Patient appears well-developed and well-nourished. No distress.  HENT: Head: Normocephalic  and atraumatic. No pain with pulling ear Pulmonary/Chest: Effort normal  Psychiatric: Patient has a normal mood and affect. behavior is normal. Judgment and thought content normal.    Assessment & Plan  1. Otalgia, right Muffled sounds and no pain- discussed not likely ear infection, reviewed physical note- no TM perforation due to past ear tubes, recommend liquid colace and warm water irrigation. If not improved come in to office. Discussed prevention of swimmers ear as she is going to the beach soon. discussed claritin for one week possible but less likely eustachian tube dysfunction   2. History of impacted cerumen    Follow Up Instructions:    I discussed the  assessment and treatment plan with the patient. The patient was provided an opportunity to ask questions and all were answered. The patient agreed with the plan and demonstrated an understanding of the instructions.   The patient was advised to call back or seek an in-person evaluation if the symptoms worsen or if the condition fails to improve as anticipated.  I provided 12 minutes of non-face-to-face time during this encounter.   Fredderick Severance, NP

## 2018-12-16 NOTE — Patient Instructions (Addendum)
- Ask your pharmacist about docusate sodium (colace) ear drops. Do not use it more than 3 times in week. If it still has not relieved sensation please make an in person appointment in the office for ear irrigation or cerumen removal.   To reduce the risk of swimmer's ear: DO keep your ears as dry as possible. Use a bathing cap, ear plugs, or custom-fitted swim molds when swimming. DO dry your ears thoroughly after swimming or showering. Use a towel to dry your ears well.  Lie on your side with the affected ear facing up and instill 1 mL (about 15 drops) of liquid Colace into the ear. Let the liquid sit in the ear for 15 minutes so it can work. After 15 minutes, rinse the affected ear with warm water so that you can get the softened earwax out of your canal. This is a very effective and inexpensive option instead of prepared earwax removal products. (Do not use the syrup formulation in the children's laxative section.) If you can't find a bottle of liquid formula, use the stool softener capsules. Cut the tip off and use the liquid inside, 1-3 capsules.   - take a daily antihistamine before bedtime such as claritin (loratadine) or zyrtec (certirizine) for one week; incase the muffled sounds are fluid build up behind your ear drum.  Ear Wax Handout "Never put anything smaller than your elbow in your ear!" The Outer Ear and Canal The outer ear is the funnel-like part of the ear you can see on the side of the head, plus the ear canal (the hole which leads down to the eardrum). The ear canal is shaped somewhat like an hourglass-narrowing part way down. The skin of the outer part of the canal has special glands that produce earwax. This wax is supposed to trap dust and dirt particles to keep them from reaching the eardrum. Usually the wax accumulates a bit, dries out and then comes tumbling out of the ear, carrying dirt and dust with it. Or it may slowly migrate to the outside where it can be wiped off.  The ear canal may be blocked by wax when attempts to clean the ear push wax deeper into the ear canal and cause a blockage. Wax blockage is one of the most common causes of hearing loss. Should you clean your ears? Wax is not formed in the deep part of the ear canal near the eardrum, but only in the outer part of the canal. So when a patient has wax blocked up against the eardrum, it is often because he been probing his ear with such things as cotton-tipped applicators (Qtips). These objects only push the wax in deeper. Also, the skin of the ear canal and the eardrum is very thin and fragile and is easily injured. Earwax is healthy in normal amounts and serves to coat the skin of the ear canal where it acts as a temporary water repellent. The absence of earwax may result in dry, itchy ears. Most of the time the ear canals are self-cleaning; that is, there is a slow and orderly migration of ear canal skin from the eardrum to the ear opening. Old earwax is constantly being transported from the ear canal to the ear opening where it usually dries, flakes, and falls out. Under ideal circumstances, you should never have to clean your ear canals. However, we all know that this isn't always so. If you want to clean your ears, you can wash the external ear with  a cloth over a finger, but do not insert anything into the ear canal. Self-Treatment Ear wax blockage may respond to home treatments used to soften wax if there is no hole in the eardrum. Patients can try placing a few drops of mineral oil, baby oil, glycerin, or commercial drops, such as Debrox, or Murine Ear Drops in the ear. Some patients may run into problems with recurring wax buildup either secondary to an over production of wax, narrow ear canals, or a combination of these or other factors. In these situations regular use of ear drops may be helpful.   Common regimens include:  .  Debrox or Murine Ear Drops 5 drops in each ear at  bedtime 1-2 nights per month  .  Mineral oil applied with an ear dropper, 5 drops in each ear at bedtime 1-2 nights per month If using the above regimens, it is not necessary to "irrigate" or wash your ear. The drops will dissolve small amounts of wax and prevent a major accumulation or "impaction" of wax. Some over the counter ear cleaning systems have a small bulb to irrigate or wash out the ear. Patients should know that rinsing the ear canal with hydrogen peroxide (H2O2) results in oxygen bubbling off and water being left behind-wet, warm ear canals make good incubators for growth of bacteria. If you attempt to flush your ear canal to remove wax, it is recommended that you add a few tablespoons of white vinegar to the water to acidify it, and afterwards apply several drops of rubbing alcohol to wick away moisture and help dry the ear. When should I see my doctor? If you are uncertain whether you have a hole (perforation or puncture) in your eardrum, consult your doctor prior to trying any over-the-counter remedies. Putting eardrops or other products in your ear in the presence of an eardrum perforation may cause an infection and pain. Certainly, washing water through such a hole could start an infection. In the event that the home treatments discussed above are not satisfactory or if wax has accumulated so much that it blocks the ear canal, and hearing, your doctor may prescribe eardrops designed to soften wax, or he or she may wash or vacuum it out. Patricia Dickerson

## 2019-07-18 ENCOUNTER — Ambulatory Visit: Payer: Medicaid Other | Admitting: Family Medicine

## 2019-08-04 ENCOUNTER — Ambulatory Visit: Payer: Medicaid Other | Admitting: Internal Medicine

## 2019-08-11 ENCOUNTER — Encounter: Payer: Self-pay | Admitting: Internal Medicine

## 2019-08-11 ENCOUNTER — Ambulatory Visit (INDEPENDENT_AMBULATORY_CARE_PROVIDER_SITE_OTHER): Payer: Medicaid Other | Admitting: Internal Medicine

## 2019-08-11 DIAGNOSIS — Z5329 Procedure and treatment not carried out because of patient's decision for other reasons: Secondary | ICD-10-CM

## 2019-08-11 DIAGNOSIS — Z91199 Patient's noncompliance with other medical treatment and regimen due to unspecified reason: Secondary | ICD-10-CM

## 2019-08-11 NOTE — Progress Notes (Signed)
Name: Patricia Dickerson   MRN: [id number]    DOB: [birth year]   Date:08/11/2019       Progress Note  Subjective  Chief Complaint  Chief Complaint  Patient presents with  . Menstrual Problem    pain is extreme the first 2-3 days   . Medication Refill    Ibuprofen 800mg  is the only thing that helps with her mentrual pain  . Referral    patient suspects plantar fasiitis    I connected with  Gar Gibbon  on 08/11/19 at  1:20 PM EDT by a video enabled telemedicine application and verified that I am speaking with the correct person using two identifiers.  I discussed the limitations of evaluation and management by telemedicine and the availability of in person appointments. The patient expressed understanding and agreed to proceed. Staff also discussed with the patient that there may be a patient responsible charge related to this service. Patient Location: Home Provider Location: [facility] Additional Individuals present: none  HPI     Patient Active Problem List   Diagnosis Date Noted  . Anxiety 10/04/2018  . Severe obesity due to excess calories without serious comorbidity with body mass index (BMI) greater than 99th percentile for age in pediatric patient ([place]) 11/10/2016  . Headache in front of head 01/12/2014  . Gastroesophageal reflux 12/26/2013    Past Surgical History:  Procedure Laterality Date  . MOUTH SURGERY      Family History  Problem Relation Age of Onset  . Thyroid disease Mother   . Diabetes Father     Social History   Tobacco Use  . Smoking status: Never Smoker  . Smokeless tobacco: Never Used  Substance Use Topics  . Alcohol use: Never     Current Outpatient Medications:  .  Ascorbic Acid (VITAMIN C) 100 MG tablet, Take 100 mg by mouth daily., Disp: , Rfl:  .  busPIRone (BUSPAR) 7.5 MG tablet, Take 1 tablet once daily at night, may take 1 add'l tab during the day as needed for anxiety., Disp: 45 tablet, Rfl: 1 .  omeprazole (PRILOSEC) 20 MG  capsule, Take by mouth., Disp: , Rfl:   Allergies  Allergen Reactions  . Amoxicillin Hives and Rash    With staff assistance, above reviewed with the patient/caregiver today.     Objective  Virtual encounter, vitals not obtained.  There is no height or weight on file to calculate BMI.  Physical Exam     No results found for this or any previous visit (from the past 72 hour(s)).  PHQ2/9: Depression screen [facility] 2/9 08/11/2019 12/16/2018 11/04/2018 10/14/2018 10/04/2018  Decreased Interest 0 0 0 0 0  Down, Depressed, Hopeless 0 0 0 0 0  PHQ - 2 Score 0 0 0 0 0  Altered sleeping 0 0 0 0 3  Tired, decreased energy 0 0 0 0 1  Change in appetite 0 0 0 0 0  Feeling bad or failure about yourself  0 0 0 0 0  Trouble concentrating 0 0 0 0 0  Moving slowly or fidgety/restless 0 0 0 0 0  Suicidal thoughts 0 0 0 0 0  PHQ-9 Score 0 0 0 0 4  Difficult doing work/chores Not difficult at all Not difficult at all - Not difficult at all Not difficult at all   PHQ-2/9 Result reviewed - neg  Fall Risk: Fall Risk  08/11/2019 12/16/2018 11/04/2018 10/14/2018 10/04/2018  Falls in the past year? 1 0 0 1  0  Number falls in past yr: 0 0 0 0 0  Injury with Fall? 0 0 0 1 0  Follow up - - - - Falls evaluation completed     Assessment & Plan  Attempted to reach patient by video communication at 1:20 PM, her scheduled appointment time.  After no response in 5 minutes, I phoned the patient and there was no answer, and a voicemail was left informing her of the attempts to reach her and to call the office back when she is available.  The patient was attempted to be reached by phone by [employer] in the early afternoon and Melissa reported to me that there was no answer when she attempted.  Dr. Dorris Fetch        I discussed the assessment and treatment plan with the patient. The patient was provided an opportunity to ask questions and all were answered. The patient agreed with the plan and demonstrated  an understanding of the instructions.  Red flags and when to present for emergency care or RTC including fever >101.31F, chest pain, shortness of breath, new/worsening/un-resolving symptoms reviewed with patient at time of visit.  The patient was advised to call back or seek an in-person evaluation if the symptoms worsen or if the condition fails to improve as anticipated.  I provided  minutes of non-face-to-face time during this encounter that included discussing at length patient's sx/history, pertinent pmhx, medications, treatment and follow up plan. This time also included the necessary documentation, orders, and chart review.

## 2019-09-14 ENCOUNTER — Other Ambulatory Visit (HOSPITAL_COMMUNITY)
Admission: RE | Admit: 2019-09-14 | Discharge: 2019-09-14 | Disposition: A | Discharge status: Home / Self Care | Payer: Medicaid Other | Source: Ambulatory Visit | Attending: Family Medicine | Admitting: Family Medicine

## 2019-09-14 ENCOUNTER — Ambulatory Visit (INDEPENDENT_AMBULATORY_CARE_PROVIDER_SITE_OTHER): Payer: Medicaid Other | Admitting: Family Medicine

## 2019-09-14 ENCOUNTER — Encounter: Payer: Self-pay | Admitting: Family Medicine

## 2019-09-14 ENCOUNTER — Other Ambulatory Visit: Payer: Self-pay

## 2019-09-14 DIAGNOSIS — F419 Anxiety disorder, unspecified: Secondary | ICD-10-CM | POA: Diagnosis not present

## 2019-09-14 DIAGNOSIS — N946 Dysmenorrhea, unspecified: Secondary | ICD-10-CM | POA: Diagnosis not present

## 2019-09-14 DIAGNOSIS — R202 Paresthesia of skin: Secondary | ICD-10-CM

## 2019-09-14 DIAGNOSIS — N920 Excessive and frequent menstruation with regular cycle: Secondary | ICD-10-CM

## 2019-09-14 DIAGNOSIS — Z124 Encounter for screening for malignant neoplasm of cervix: Secondary | ICD-10-CM | POA: Diagnosis not present

## 2019-09-14 DIAGNOSIS — Z113 Encounter for screening for infections with a predominantly sexual mode of transmission: Secondary | ICD-10-CM | POA: Insufficient documentation

## 2019-09-14 DIAGNOSIS — Z3009 Encounter for other general counseling and advice on contraception: Secondary | ICD-10-CM | POA: Diagnosis not present

## 2019-09-14 DIAGNOSIS — M79671 Pain in right foot: Secondary | ICD-10-CM | POA: Diagnosis not present

## 2019-09-14 DIAGNOSIS — N898 Other specified noninflammatory disorders of vagina: Secondary | ICD-10-CM | POA: Diagnosis not present

## 2019-09-14 DIAGNOSIS — Z13220 Encounter for screening for lipoid disorders: Secondary | ICD-10-CM | POA: Diagnosis not present

## 2019-09-14 DIAGNOSIS — K219 Gastro-esophageal reflux disease without esophagitis: Secondary | ICD-10-CM

## 2019-09-14 DIAGNOSIS — Z131 Encounter for screening for diabetes mellitus: Secondary | ICD-10-CM

## 2019-09-14 MED ORDER — OMEPRAZOLE 40 MG PO CPDR: 40.0000 mg | DELAYED_RELEASE_CAPSULE | Freq: Every day | ORAL | 1 refills | Status: DC

## 2019-09-14 NOTE — Progress Notes (Signed)
Name: Patricia Dickerson   MRN: [id number]    DOB: [birth year]   Date:09/14/2019       Progress Note  Subjective  Chief Complaint  Chief Complaint  Patient presents with  . Contraception  . Foot Pain    right foot pain, fell 1 month ago  . vaginal odor  . Gastroesophageal Reflux    refill    HPI  Contraception: she states her cycles are heavy and painful, she would like to discuss options. Explained that because of her weight ocp will likely not protect her from pregnancy. Discussed Depo and Nexplanon. She would like to try IUD   GERD: she states she needs to take Omeprazole daily, she ran out of medication for the past 6 months and has noticed food getting  stuck in her throat again , also used to have regurgitation and burning, sometimes nausea.   Anxiety: she has a long history of anxiety , never seen by therapist, takes buspar prn, she would like to see someone in person  Morbid obesity: going on for years, gained more weight since last visit about 40 lbs, discussed life style modification  Vaginal odor: going on since last year, we will check for BV, no pain, she has white to clear discharged, she has been sexually active in the past, needs to be check sti also   Right foot pain: after a fall, getting better, she wants to hold off on seeing podiatrist at this time.  Patient Active Problem List   Diagnosis Date Noted  . Anxiety 10/04/2018  . Severe obesity due to excess calories without serious comorbidity with body mass index (BMI) greater than 99th percentile for age in pediatric patient (HCC) 11/10/2016  . Headache in front of head 01/12/2014  . Gastroesophageal reflux 12/26/2013    Past Surgical History:  Procedure Laterality Date  . MOUTH SURGERY      Family History  Problem Relation Age of Onset  . Thyroid disease Mother   . Diabetes Father     Social History   Tobacco Use  . Smoking status: Never Smoker  . Smokeless tobacco: Never Used  Substance Use Topics   . Alcohol use: Never     Current Outpatient Medications:  .  Ascorbic Acid (VITAMIN C) 100 MG tablet, Take 100 mg by mouth daily., Disp: , Rfl:  .  busPIRone (BUSPAR) 7.5 MG tablet, Take 1 tablet once daily at night, may take 1 add'l tab during the day as needed for anxiety., Disp: 45 tablet, Rfl: 1 .  omeprazole (PRILOSEC) 40 MG capsule, Take 1 capsule (40 mg total) by mouth daily., Disp: 90 capsule, Rfl: 1  Allergies  Allergen Reactions  . Amoxicillin Hives and Rash    I personally reviewed active problem list, medication list, allergies, family history, social history, health maintenance with the patient/caregiver today.   ROS  Constitutional: Negative for fever, positive  weight change.  Respiratory: Negative for cough and shortness of breath.   Cardiovascular: Negative for chest pain or palpitations.  Gastrointestinal: Negative for abdominal pain, no bowel changes.  Musculoskeletal: Negative for gait problem or joint swelling.  Skin: Negative for rash.  Neurological: Negative for dizziness or headache.  No other specific complaints in a complete review of systems (except as listed in HPI above).  Objective  Vitals:   09/14/19 0912  BP: 124/76  Pulse: 86  Resp: 18  Temp: (!) 97.3 F (36.3 C)  TempSrc: Temporal  SpO2: 98%  Weight: (!) 476 lb  3.2 oz (216 kg)  Height: 6\' 2"  (1.88 m)    Body mass index is 61.14 kg/m.  Physical Exam  Constitutional: Patient appears well-developed and well-nourished. Morbid obese  No distress.  HEENT: head atraumatic, normocephalic, pupils equal and reactive to light Cardiovascular: Normal rate, regular rhythm and normal heart sounds.  No murmur heard. No BLE edema. Pulmonary/Chest: Effort normal and breath sounds normal. No respiratory distress. Abdominal: Soft.  There is no tenderness. Psychiatric: Patient has a normal mood and affect. behavior is normal. Judgment and thought content normal. Muscular Skeletal: no deformities on  foot, normal rom  PHQ2/9: Depression screen [facility] 2/9 09/14/2019 08/11/2019 12/16/2018 11/04/2018 10/14/2018  Decreased Interest 0 0 0 0 0  Down, Depressed, Hopeless 0 0 0 0 0  PHQ - 2 Score 0 0 0 0 0  Altered sleeping 0 0 0 0 0  Tired, decreased energy 0 0 0 0 0  Change in appetite 0 0 0 0 0  Feeling bad or failure about yourself  0 0 0 0 0  Trouble concentrating 0 0 0 0 0  Moving slowly or fidgety/restless 0 0 0 0 0  Suicidal thoughts 0 0 0 0 0  PHQ-9 Score 0 0 0 0 0  Difficult doing work/chores Not difficult at all Not difficult at all Not difficult at all - Not difficult at all    phq 9 is negative  GAD 7 : Generalized Anxiety Score 10/04/2018  Nervous, Anxious, on Edge 2  Control/stop worrying 0  Worry too much - different things 2  Trouble relaxing 0  Restless 0  Easily annoyed or irritable 0  Afraid - awful might happen 0  Total GAD 7 Score 4  Anxiety Difficulty Somewhat difficult     Fall Risk: Fall Risk  09/14/2019 08/11/2019 12/16/2018 11/04/2018 10/14/2018  Falls in the past year? 1 1 0 0 1  Number falls in past yr: 0 0 0 0 0  Injury with Fall? 1 0 0 0 1  Follow up Falls evaluation completed - - - -     Functional Status Survey: Is the patient deaf or have difficulty hearing?: No Does the patient have difficulty seeing, even when wearing glasses/contacts?: No Does the patient have difficulty concentrating, remembering, or making decisions?: No Does the patient have difficulty walking or climbing stairs?: No Does the patient have difficulty dressing or bathing?: No Does the patient have difficulty doing errands alone such as visiting a doctor's office or shopping?: No    Assessment & Plan  1. Encounter for counseling regarding contraception  - Ambulatory referral to Obstetrics / Gynecology  2. Vaginal odor  Check wet smear   3. Anxiety  - Ambulatory referral to Psychology  4. Morbid obesity (HCC)  - COMPLETE METABOLIC PANEL WITH GFR  5. Dysmenorrhea  in adolescent   6. Cervical cancer screening  - Ambulatory referral to Obstetrics / Gynecology  7. Diabetes mellitus screening  - Hemoglobin A1c  8. Lipid screening  - Lipid panel  9. Paresthesia  - Vitamin B12 - COMPLETE METABOLIC PANEL WITH GFR  10. Menorrhagia with regular cycle  - CBC with Differential/Platelet  11. Gastroesophageal reflux disease without esophagitis  - omeprazole (PRILOSEC) 40 MG capsule; Take 1 capsule (40 mg total) by mouth daily.  Dispense: 90 capsule; Refill: 1   12. Right foot pain  She wants to hold off on seeing podiatrist  13. Routine screening for STI (sexually transmitted infection)  - RPR - HIV Antibody (routine  testing w rflx) - Cervicovaginal ancillary only

## 2019-09-15 LAB — CERVICOVAGINAL ANCILLARY ONLY
Bacterial Vaginitis (gardnerella): NEGATIVE
Candida Glabrata: NEGATIVE
Candida Vaginitis: NEGATIVE
Chlamydia: NEGATIVE
Comment: NEGATIVE
Comment: NEGATIVE
Comment: NEGATIVE
Comment: NEGATIVE
Comment: NEGATIVE
Comment: NORMAL
Neisseria Gonorrhea: NEGATIVE
Trichomonas: NEGATIVE

## 2019-09-15 LAB — COMPLETE METABOLIC PANEL WITH GFR
AG Ratio: 1.5 (calc) (ref 1.0–2.5)
ALT: 18 U/L (ref 6–29)
AST: 14 U/L (ref 10–30)
Albumin: 4 g/dL (ref 3.6–5.1)
Alkaline phosphatase (APISO): 62 U/L (ref 31–125)
BUN: 9 mg/dL (ref 7–25)
CO2: 26 mmol/L (ref 20–32)
Calcium: 9.7 mg/dL (ref 8.6–10.2)
Chloride: 107 mmol/L (ref 98–110)
Creat: 0.78 mg/dL (ref 0.50–1.10)
GFR, Est African American: 126 mL/min/{1.73_m2} (ref >=60)
GFR, Est Non African American: 109 mL/min/{1.73_m2} (ref >=60)
Globulin: 2.6 g/dL (calc) (ref 1.9–3.7)
Glucose, Bld: 99 mg/dL (ref 65–99)
Potassium: 4.3 mmol/L (ref 3.5–5.3)
Sodium: 141 mmol/L (ref 135–146)
Total Bilirubin: 0.4 mg/dL (ref 0.2–1.2)
Total Protein: 6.6 g/dL (ref 6.1–8.1)

## 2019-09-15 LAB — CBC WITH DIFFERENTIAL/PLATELET
Absolute Monocytes: 531 cells/uL (ref 200–950)
Basophils Absolute: 31 cells/uL (ref 0–200)
Basophils Relative: 0.4 %
Eosinophils Absolute: 146 cells/uL (ref 15–500)
Eosinophils Relative: 1.9 %
HCT: 42.8 % (ref 35.0–45.0)
Hemoglobin: 13.9 g/dL (ref 11.7–15.5)
Lymphs Abs: 2533 cells/uL (ref 850–3900)
MCH: 29 pg (ref 27.0–33.0)
MCHC: 32.5 g/dL (ref 32.0–36.0)
MCV: 89.4 fL (ref 80.0–100.0)
MPV: 11 fL (ref 7.5–12.5)
Monocytes Relative: 6.9 %
Neutro Abs: 4458 cells/uL (ref 1500–7800)
Neutrophils Relative %: 57.9 %
Platelets: 371 10*3/uL (ref 140–400)
RBC: 4.79 10*6/uL (ref 3.80–5.10)
RDW: 12 % (ref 11.0–15.0)
Total Lymphocyte: 32.9 %
WBC: 7.7 10*3/uL (ref 3.8–10.8)

## 2019-09-15 LAB — VITAMIN B12: Vitamin B-12: 253 pg/mL (ref 200–1100)

## 2019-09-15 LAB — LIPID PANEL
Cholesterol: 156 mg/dL (ref <200)
HDL: 37 mg/dL — ABNORMAL LOW (ref >=50)
LDL Cholesterol (Calc): 100 mg/dL (calc) — ABNORMAL HIGH
Non-HDL Cholesterol (Calc): 119 mg/dL (calc) (ref <130)
Total CHOL/HDL Ratio: 4.2 (calc) (ref <5.0)
Triglycerides: 96 mg/dL (ref <150)

## 2019-09-15 LAB — HIV ANTIBODY (ROUTINE TESTING W REFLEX): HIV 1&2 Ab, 4th Generation: NONREACTIVE

## 2019-09-15 LAB — HEMOGLOBIN A1C
Hgb A1c MFr Bld: 5.5 % of total Hgb (ref <5.7)
Mean Plasma Glucose: 111 (calc)
eAG (mmol/L): 6.2 (calc)

## 2019-09-19 ENCOUNTER — Encounter: Payer: Self-pay | Admitting: Family Medicine

## 2019-09-20 NOTE — Telephone Encounter (Signed)
You saw this patient in the last couple days

## 2019-09-27 NOTE — Patient Instructions (Incomplete)
I value your feedback and entrusting us with your care. If you get a [place] patient survey, I would appreciate you taking the time to let us know about your experience today. Thank you!  As of May 05, 2019, your lab results will be released to your MyChart immediately, before I even have a chance to see them. Please give me time to review them and contact you if there are any abnormalities. Thank you for your patience.  

## 2019-09-27 NOTE — Progress Notes (Deleted)
PCP:  Doren Custard, FNP   No chief complaint on file.    HPI:      [name] is a 19 y.o. No obstetric history on file. who LMP was No LMP recorded., presents today for her NP annual examination.  Her menses are {norm/abn:715}, lasting {number:22536} days.  Dysmenorrhea {dysmen:716}. She {does:18564} have intermenstrual bleeding.  Sex activity: {sex active:315163}.  Last Pap: {ZTIW:580998338}  Results were: {norm/abn:16707::"no abnormalities"} /neg HPV DNA *** Hx of STDs: {STD hx:14358} Neg BV/yeast/STD testing 4/21 with PCP due to vaginal odor   There is no FH of breast cancer. There is no FH of ovarian cancer. The patient {does:18564} do self-breast exams.  Tobacco use: {tob:20664} Alcohol use: {Alcohol:11675} No drug use.  Exercise: {exercise:31265}  She {does:18564} get adequate calcium and Vitamin D in her diet.   No past medical history on file.  Past Surgical History:  Procedure Laterality Date  . MOUTH SURGERY      Family History  Problem Relation Age of Onset  . Thyroid disease Mother   . Diabetes Father     Social History   Socioeconomic History  . Marital status: Single    Spouse name: Not on file  . Number of children: 0  . Years of education: Not on file  . Highest education level: Not on file  Occupational History  . Not on file  Tobacco Use  . Smoking status: Never Smoker  . Smokeless tobacco: Never Used  Substance and Sexual Activity  . Alcohol use: Never  . Drug use: Never  . Sexual activity: Never    Partners: Male  Other Topics Concern  . Not on file  Social History Narrative  . Not on file   Social Determinants of Health   Financial Resource Strain:   . Difficulty of Paying Living Expenses:   Food Insecurity:   . Worried About Programme researcher, broadcasting/film/video in the Last Year:   . Barista in the Last Year:   Transportation Needs:   . Freight forwarder (Medical):   Marland Kitchen Lack of Transportation (Non-Medical):     Physical Activity:   . Days of Exercise per Week:   . Minutes of Exercise per Session:   Stress:   . Feeling of Stress :   Social Connections:   . Frequency of Communication with Friends and Family:   . Frequency of Social Gatherings with Friends and Family:   . Attends Religious Services:   . Active Member of Clubs or Organizations:   . Attends Banker Meetings:   Marland Kitchen Marital Status:   Intimate Partner Violence:   . Fear of Current or Ex-Partner:   . Emotionally Abused:   Marland Kitchen Physically Abused:   . Sexually Abused:      Current Outpatient Medications:  .  Ascorbic Acid (VITAMIN C) 100 MG tablet, Take 100 mg by mouth daily., Disp: , Rfl:  .  busPIRone (BUSPAR) 7.5 MG tablet, Take 1 tablet once daily at night, may take 1 add'l tab during the day as needed for anxiety., Disp: 45 tablet, Rfl: 1 .  omeprazole (PRILOSEC) 40 MG capsule, Take 1 capsule (40 mg total) by mouth daily., Disp: 90 capsule, Rfl: 1     ROS:  Review of Systems BREAST: No symptoms   Objective: There were no vitals taken for this visit.   OBGyn Exam  Results: No results found for this or any previous visit (from the past 24 hour(s)).  Assessment/Plan: No diagnosis found.  No orders of the defined types were placed in this encounter.            GYN counsel {counseling:16159}     F/U  No follow-ups on file.  Yentl Verge B. Kimmberly Wisser, PA-C 09/27/2019 3:41 PM

## 2019-09-28 ENCOUNTER — Encounter: Payer: Medicaid Other | Admitting: Obstetrics and Gynecology

## 2019-10-05 ENCOUNTER — Encounter: Payer: Medicaid Other | Admitting: Obstetrics and Gynecology

## 2019-10-05 NOTE — Progress Notes (Deleted)
PCP:  Doren Custard, FNP   No chief complaint on file.    HPI:      [name] is a 19 y.o. No obstetric history on file. who LMP was No LMP recorded., presents today for her NP annual examination.  Her menses are {norm/abn:715}, lasting {number:22536} days.  Dysmenorrhea {dysmen:716}. She {does:18564} have intermenstrual bleeding.  Sex activity: {sex active:315163}.  Last Pap: {WUJW:[id number]}  Results were: {norm/abn:16707::"no abnormalities"} /neg HPV DNA *** Hx of STDs: {STD hx:14358} Neg BV/yeast/STD testing 4/21 with PCP due to vaginal odor   There is no FH of breast cancer. There is no FH of ovarian cancer. The patient {does:18564} do self-breast exams.  Tobacco use: {tob:20664} Alcohol use: {Alcohol:11675} No drug use.  Exercise: {exercise:31265}  She {does:18564} get adequate calcium and Vitamin D in her diet.   No past medical history on file.  Past Surgical History:  Procedure Laterality Date  . MOUTH SURGERY      Family History  Problem Relation Age of Onset  . Thyroid disease Mother   . Diabetes Father     Social History   Socioeconomic History  . Marital status: Single    Spouse name: Not on file  . Number of children: 0  . Years of education: Not on file  . Highest education level: Not on file  Occupational History  . Not on file  Tobacco Use  . Smoking status: Never Smoker  . Smokeless tobacco: Never Used  Substance and Sexual Activity  . Alcohol use: Never  . Drug use: Never  . Sexual activity: Never    Partners: Male  Other Topics Concern  . Not on file  Social History Narrative  . Not on file   Social Determinants of Health   Financial Resource Strain:   . Difficulty of Paying Living Expenses:   Food Insecurity:   . Worried About Programme researcher, broadcasting/film/video in the Last Year:   . Barista in the Last Year:   Transportation Needs:   . Freight forwarder (Medical):   Marland Kitchen Lack of Transportation (Non-Medical):     Physical Activity:   . Days of Exercise per Week:   . Minutes of Exercise per Session:   Stress:   . Feeling of Stress :   Social Connections:   . Frequency of Communication with Friends and Family:   . Frequency of Social Gatherings with Friends and Family:   . Attends Religious Services:   . Active Member of Clubs or Organizations:   . Attends Banker Meetings:   Marland Kitchen Marital Status:   Intimate Partner Violence:   . Fear of Current or Ex-Partner:   . Emotionally Abused:   Marland Kitchen Physically Abused:   . Sexually Abused:      Current Outpatient Medications:  .  Ascorbic Acid (VITAMIN C) 100 MG tablet, Take 100 mg by mouth daily., Disp: , Rfl:  .  busPIRone (BUSPAR) 7.5 MG tablet, Take 1 tablet once daily at night, may take 1 add'l tab during the day as needed for anxiety., Disp: 45 tablet, Rfl: 1 .  omeprazole (PRILOSEC) 40 MG capsule, Take 1 capsule (40 mg total) by mouth daily., Disp: 90 capsule, Rfl: 1     ROS:  Review of Systems BREAST: No symptoms   Objective: There were no vitals taken for this visit.   OBGyn Exam  Results: No results found for this or any previous visit (from the past 24 hour(s)).  Assessment/Plan: No diagnosis found.  No orders of the defined types were placed in this encounter.            GYN counsel {counseling:16159}     F/U  No follow-ups on file.  Kelby Adell B. Lindie Roberson, PA-C 10/05/2019 11:02 AM

## 2019-10-13 ENCOUNTER — Encounter: Payer: Medicaid Other | Admitting: Obstetrics and Gynecology

## 2019-10-14 ENCOUNTER — Ambulatory Visit: Payer: Medicaid Other | Admitting: Family Medicine

## 2019-10-19 DIAGNOSIS — Z20822 Contact with and (suspected) exposure to covid-19: Secondary | ICD-10-CM | POA: Diagnosis not present

## 2019-11-02 ENCOUNTER — Encounter: Payer: Self-pay | Admitting: Obstetrics and Gynecology

## 2019-11-02 ENCOUNTER — Ambulatory Visit (INDEPENDENT_AMBULATORY_CARE_PROVIDER_SITE_OTHER): Payer: Medicaid Other | Admitting: Obstetrics and Gynecology

## 2019-11-02 ENCOUNTER — Other Ambulatory Visit: Payer: Self-pay

## 2019-11-02 ENCOUNTER — Other Ambulatory Visit (HOSPITAL_COMMUNITY)
Admission: RE | Admit: 2019-11-02 | Discharge: 2019-11-02 | Disposition: A | Discharge status: Home / Self Care | Payer: Medicaid Other | Source: Ambulatory Visit | Attending: Obstetrics and Gynecology | Admitting: Obstetrics and Gynecology

## 2019-11-02 VITALS — BP 120/72 | Ht 73.0 in | Wt >= 6400 oz

## 2019-11-02 DIAGNOSIS — Z803 Family history of malignant neoplasm of breast: Secondary | ICD-10-CM | POA: Diagnosis not present

## 2019-11-02 DIAGNOSIS — N946 Dysmenorrhea, unspecified: Secondary | ICD-10-CM | POA: Diagnosis not present

## 2019-11-02 DIAGNOSIS — Z113 Encounter for screening for infections with a predominantly sexual mode of transmission: Secondary | ICD-10-CM | POA: Diagnosis not present

## 2019-11-02 DIAGNOSIS — Z124 Encounter for screening for malignant neoplasm of cervix: Secondary | ICD-10-CM | POA: Insufficient documentation

## 2019-11-02 DIAGNOSIS — Z30014 Encounter for initial prescription of intrauterine contraceptive device: Secondary | ICD-10-CM | POA: Diagnosis not present

## 2019-11-02 DIAGNOSIS — E559 Vitamin D deficiency, unspecified: Secondary | ICD-10-CM | POA: Diagnosis not present

## 2019-11-02 MED ORDER — IBUPROFEN 800 MG PO TABS: 800.0000 mg | ORAL_TABLET | Freq: Three times a day (TID) | ORAL | 0 refills | Status: AC | PRN

## 2019-11-02 MED ORDER — MISOPROSTOL 100 MCG PO TABS: 100.0000 ug | ORAL_TABLET | Freq: Once | ORAL | 0 refills | Status: DC

## 2019-11-02 NOTE — Patient Instructions (Signed)
I value your feedback and entrusting us with your care. If you get a [facility] patient survey, I would appreciate you taking the time to let us know about your experience today. Thank you!  As of May 05, 2019, your lab results will be released to your MyChart immediately, before I even have a chance to see them. Please give me time to review them and contact you if there are any abnormalities. Thank you for your patience.  

## 2019-11-02 NOTE — Progress Notes (Signed)
PCP:  Alba Cory, MD   Chief Complaint  Patient presents with   Referral    Pap and interested in IUD     HPI:      [name] is a 19 y.o. G0P0000 whose LMP was Patient's last menstrual period was 10/23/2019 (exact date)., presents today for NP pap and IUD consult, referred by PCP.  Her menses are regular every 28-30 days, lasting 5 days.  Dysmenorrhea severe, occurring first 1-2 days of flow. Used to take ibup 800 mg with sx control but ran out of Rx. She does not have intermenstrual bleeding. She would like IUD for period relief. Has not done any BC in past. No hx of HTN, DVTs, migraines with aura.   Sex activity: not sexually active. Has been in past. Last Pap: N/A in past due to age Hx of STDs: none  There is a FH of breast cancer in her pat aunt and PGM. Pt unsure of ages. There is no FH of ovarian cancer.   Tobacco use: The patient denies current or previous tobacco use. Alcohol use: none No drug use.  Exercise: moderately active  She does not get adequate calcium or Vitamin D in her diet and was recently diagnosed with Vit D deficiency. Hasn't started supp yet.  Did 2 Gard vaccine but had side effects after 2nd dose, so not doing 3rd dose.   Past Medical History:  Diagnosis Date   Vitamin D deficiency     Past Surgical History:  Procedure Laterality Date   MOUTH SURGERY      Family History  Problem Relation Age of Onset   Thyroid disease Mother    Diabetes Father    Breast cancer Paternal Aunt        18s   Breast cancer Paternal Grandmother        not sure of age    Social History   Socioeconomic History   Marital status: Single    Spouse name: Not on file   Number of children: 0   Years of education: Not on file   Highest education level: Not on file  Occupational History   Not on file  Tobacco Use   Smoking status: Never Smoker   Smokeless tobacco: Never Used  Substance and Sexual Activity   Alcohol use: Not  Currently   Drug use: Never   Sexual activity: Not Currently    Partners: Male    Birth control/protection: None  Other Topics Concern   Not on file  Social History Narrative   Not on file   Social Determinants of Health   Financial Resource Strain:    Difficulty of Paying Living Expenses:   Food Insecurity:    Worried About Programme researcher, broadcasting/film/video in the Last Year:    Barista in the Last Year:   Transportation Needs:    Freight forwarder (Medical):    Lack of Transportation (Non-Medical):   Physical Activity:    Days of Exercise per Week:    Minutes of Exercise per Session:   Stress:    Feeling of Stress :   Social Connections:    Frequency of Communication with Friends and Family:    Frequency of Social Gatherings with Friends and Family:    Attends Religious Services:    Active Member of Clubs or Organizations:    Attends Banker Meetings:    Marital Status:   Intimate Partner Violence:    Fear of  Current or Ex-Partner:    Emotionally Abused:    Physically Abused:    Sexually Abused:      Current Outpatient Medications:    Ascorbic Acid (VITAMIN C) 100 MG tablet, Take 100 mg by mouth daily., Disp: , Rfl:    busPIRone (BUSPAR) 7.5 MG tablet, Take 1 tablet once daily at night, may take 1 add'l tab during the day as needed for anxiety., Disp: 45 tablet, Rfl: 1   omeprazole (PRILOSEC) 40 MG capsule, Take 1 capsule (40 mg total) by mouth daily., Disp: 90 capsule, Rfl: 1   ibuprofen (ADVIL) 800 MG tablet, Take 1 tablet (800 mg total) by mouth every 8 (eight) hours as needed., Disp: 30 tablet, Rfl: 0   misoprostol (CYTOTEC) 100 MCG tablet, Take 1 tablet (100 mcg total) by mouth once for 1 dose. 1 hour before appt, Disp: 1 tablet, Rfl: 0     ROS:  Review of Systems  Constitutional: Negative for fatigue, fever and unexpected weight change.  Respiratory: Negative for cough, shortness of breath and wheezing.     Cardiovascular: Negative for chest pain, palpitations and leg swelling.  Gastrointestinal: Negative for blood in stool, constipation, diarrhea, nausea and vomiting.  Endocrine: Negative for cold intolerance, heat intolerance and polyuria.  Genitourinary: Negative for dyspareunia, dysuria, flank pain, frequency, genital sores, hematuria, menstrual problem, pelvic pain, urgency, vaginal bleeding, vaginal discharge and vaginal pain.  Musculoskeletal: Negative for back pain, joint swelling and myalgias.  Skin: Negative for rash.  Neurological: Negative for dizziness, syncope, light-headedness, numbness and headaches.  Hematological: Negative for adenopathy.  Psychiatric/Behavioral: Negative for agitation, confusion, sleep disturbance and suicidal ideas. The patient is not nervous/anxious.     Objective: BP 120/72    Ht 6\' 1"  (1.854 m)    Wt (!) 473 lb (214.6 kg)    LMP 10/23/2019 (Exact Date)    BMI 62.40 kg/m    Physical Exam Constitutional:      General: She is not in acute distress. Genitourinary:     Vulva, vagina, cervix, uterus, right adnexa and left adnexa normal.     No vulval lesion, tenderness or ulcerations noted.     No vaginal discharge, erythema, tenderness or bleeding.     Uterus is not enlarged or tender.     No right or left adnexal mass present.     Right adnexa not tender.     Left adnexa not tender.  Pulmonary:     Effort: Pulmonary effort is normal.  Musculoskeletal:        General: Normal range of motion.  Neurological:     General: No focal deficit present.     Mental Status: She is alert.     Cranial Nerves: No cranial nerve deficit.  Skin:    General: Skin is warm and dry.  Psychiatric:        Mood and Affect: Mood normal.        Behavior: Behavior normal.        Thought Content: Thought content normal.        Judgment: Judgment normal.  Vitals and nursing note reviewed.     Assessment/Plan: Cervical cancer screening - Plan: Cytology -  PAP  Screening for STD (sexually transmitted disease) - Plan: Cytology - PAP  Encounter for initial prescription of intrauterine contraceptive device (IUD) - Plan: misoprostol (CYTOTEC) 100 MCG tablet; IUD pros/cons/risks/benefits discussed. Will RTO with menses for [facility]. Handout given. Rx cytotec/NSAIDs 1 hr before appt.  Dysmenorrhea - Plan: ibuprofen (ADVIL) 800  MG tablet; Rx RF ibup. Hope IUD gives some relief.   Vitamin D deficiency--add OTC Vit D3 5000 IU daily  Family history of breast cancer--pt to clarify age of dx. May qualify for cancer genetic testing.   Meds ordered this encounter  Medications   misoprostol (CYTOTEC) 100 MCG tablet    Sig: Take 1 tablet (100 mcg total) by mouth once for 1 dose. 1 hour before appt    Dispense:  1 tablet    Refill:  0    Order Specific Question:   Supervising Provider    Answer:   Nadara Mustard [[id number]]   ibuprofen (ADVIL) 800 MG tablet    Sig: Take 1 tablet (800 mg total) by mouth every 8 (eight) hours as needed.    Dispense:  30 tablet    Refill:  0    Order Specific Question:   Supervising Provider    Answer:   Nadara Mustard [[id number]]             GYN counsel adequate intake of calcium and vitamin D, diet and exercise     F/U  Return for with menses for IUD insertion.  Allante Beane B. Tenaya Hilyer, PA-C 11/02/2019 4:05 PM

## 2019-11-04 LAB — CYTOLOGY - PAP
Chlamydia: NEGATIVE
Comment: NEGATIVE
Comment: NORMAL
Diagnosis: NEGATIVE
Neisseria Gonorrhea: NEGATIVE

## 2019-11-07 ENCOUNTER — Encounter: Payer: Medicaid Other | Admitting: Family Medicine

## 2019-11-09 ENCOUNTER — Other Ambulatory Visit: Payer: Self-pay | Admitting: Obstetrics and Gynecology

## 2019-11-09 DIAGNOSIS — Z30014 Encounter for initial prescription of intrauterine contraceptive device: Secondary | ICD-10-CM

## 2019-12-08 ENCOUNTER — Telehealth: Payer: Self-pay

## 2019-12-08 NOTE — Telephone Encounter (Signed)
Copied from CRM [id number]. Topic: General - Inquiry >> Dec 06, 2019  4:55 PM Deborha Payment wrote: Reason for CRM: Patient is requesting a referral to see a therapist , patient states she has discussed this with PCP during last visit.  Call back [phone]

## 2019-12-12 NOTE — Telephone Encounter (Signed)
Patient called.  Patient aware.  

## 2020-03-01 NOTE — Progress Notes (Signed)
Patient ID: Patricia Dickerson, female    DOB: 20-Apr-2001, 19 y.o.   MRN: [id number]  PCP: Alba Cory, MD  Chief Complaint  Patient presents with  . Follow-up    Subjective:   Patricia Dickerson is a 19 y.o. female, presents to clinic with CC of the following:  Chief Complaint  Patient presents with  . Follow-up    HPI:  Patient is a 19 year old female patient of Dr. Carlynn Purl Last visit with her was in April 2021 At that visit, the following was noted Morbid obesity: going on for years, gained more weight since last visit about 40 lbs, discussed life style modification Also noted was a history of anxiety and reflux. Follows up today for desired weight loss Here with her mother who is very active in the conversation.  She has continued to gain weight, and was anxious to start a medication to help.  She noted 1 was prescribed, and then was not approved, and then thinks was finally approved although they never had it available to pick up at the pharmacy and she did not start.  She did not know the name of that medicine.  She did note that Saxenda (liraglutide) was recommended and was not covered with her insurance and too expensive to start.  These medicines were recommended by Irving Burton, provider here previously. She notes she has seen nutrition in the past, always they wanted her to do is write down what she was eating all day.  She states she now eats only once a day, although is not very strict with her diet. Her mom has a trainer that she wants to start working with her daughter, although wanted to make sure that she could start doing some physical activity from a health standpoint. The patient has a history of PFO her mom's input, and did see cardiology in the past, and was told there were no concerns that needed further follow-up.  She notes she has a history of palpitations, although have not been problematic in the recent past.  She denies any history of chest pains, shortness of  breath, increased lower extremity swelling.  She is not physically active, although has been trying to walk more in the past week and denies any increased symptoms with the exertion.  She works in Airline pilot. She denies a history of higher blood pressures, diabetes, and did have labs done in April which were reviewed. She takes BuSpar for anxiety, and an omeprazole product to help with reflux symptoms. She denies any history of thyroid disease, and states she has had her thyroid tested in the past, and there is no history of thyroid cancer MEN syndromes.  Mom has a history of thyroid disease, although no thyroid cancer or MEN syndromes.  Wt Readings from Last 3 Encounters:  03/02/20 (!) 492 lb 11.2 oz (223.5 kg)  11/02/19 (!) 473 lb (214.6 kg)  09/14/19 (!) 476 lb 3.2 oz (216 kg)   Last metabolic panel Lab Results  Component Value Date   GLUCOSE 99 09/14/2019   NA 141 09/14/2019   K 4.3 09/14/2019   CL 107 09/14/2019   CO2 26 09/14/2019   BUN 9 09/14/2019   CREATININE 0.78 09/14/2019   GFRNONAA 109 09/14/2019   GFRAA 126 09/14/2019   CALCIUM 9.7 09/14/2019   PROT 6.6 09/14/2019   BILITOT 0.4 09/14/2019   AST 14 09/14/2019   ALT 18 09/14/2019   Lab Results  Component Value Date   WBC 7.7 09/14/2019  HGB 13.9 09/14/2019   HCT 42.8 09/14/2019   MCV 89.4 09/14/2019   PLT 371 09/14/2019   No results found for: TSH  Lab Results  Component Value Date   CHOL 156 09/14/2019   HDL 37 (L) 09/14/2019   LDLCALC 100 (H) 09/14/2019   TRIG 96 09/14/2019   CHOLHDL 4.2 09/14/2019     Patient Active Problem List   Diagnosis Date Noted  . Family history of breast cancer 11/02/2019  . Dysmenorrhea 11/02/2019  . Vitamin D deficiency   . Anxiety 10/04/2018  . Severe obesity due to excess calories without serious comorbidity with body mass index (BMI) greater than 99th percentile for age in pediatric patient (HCC) 11/10/2016  . Headache in front of head 01/12/2014  . Gastroesophageal  reflux 12/26/2013      Current Outpatient Medications:  .  busPIRone (BUSPAR) 7.5 MG tablet, Take 1 tablet once daily at night, may take 1 add'l tab during the day as needed for anxiety., Disp: 45 tablet, Rfl: 1 .  ibuprofen (ADVIL) 800 MG tablet, Take 1 tablet (800 mg total) by mouth every 8 (eight) hours as needed., Disp: 30 tablet, Rfl: 0 .  omeprazole (PRILOSEC) 40 MG capsule, Take 1 capsule (40 mg total) by mouth daily., Disp: 90 capsule, Rfl: 1 .  Ascorbic Acid (VITAMIN C) 100 MG tablet, Take 100 mg by mouth daily. (Patient not taking: Reported on 03/02/2020), Disp: , Rfl:    Allergies  Allergen Reactions  . Amoxicillin Hives and Rash     Past Surgical History:  Procedure Laterality Date  . MOUTH SURGERY       Family History  Problem Relation Age of Onset  . Thyroid disease Mother   . Diabetes Father   . Breast cancer Paternal Aunt        72s  . Breast cancer Paternal Grandmother        not sure of age     Social History   Tobacco Use  . Smoking status: Never Smoker  . Smokeless tobacco: Never Used  Substance Use Topics  . Alcohol use: Not Currently    With staff assistance, above reviewed with the patient today.  ROS: As per HPI, otherwise no specific complaints on a limited and focused system review   No results found for this or any previous visit (from the past 72 hour(s)).   PHQ2/9: Depression screen [facility] 2/9 03/02/2020 09/14/2019 08/11/2019 12/16/2018 11/04/2018  Decreased Interest 0 0 0 0 0  Down, Depressed, Hopeless 0 0 0 0 0  PHQ - 2 Score 0 0 0 0 0  Altered sleeping - 0 0 0 0  Tired, decreased energy - 0 0 0 0  Change in appetite - 0 0 0 0  Feeling bad or failure about yourself  - 0 0 0 0  Trouble concentrating - 0 0 0 0  Moving slowly or fidgety/restless - 0 0 0 0  Suicidal thoughts - 0 0 0 0  PHQ-9 Score - 0 0 0 0  Difficult doing work/chores - Not difficult at all Not difficult at all Not difficult at all -   PHQ-2/9 Result is neg  Fall  Risk: Fall Risk  03/02/2020 09/14/2019 08/11/2019 12/16/2018 11/04/2018  Falls in the past year? 0 1 1 0 0  Number falls in past yr: 0 0 0 0 0  Injury with Fall? 0 1 0 0 0  Follow up - Falls evaluation completed - - -  Objective:   Vitals:   03/02/20 1437  BP: 120/60  Pulse: (!) 105  Resp: 16  Temp: 98.5 F (36.9 C)  TempSrc: Oral  SpO2: 99%  Weight: (!) 492 lb 11.2 oz (223.5 kg)  Height: 6\' 1"  (1.854 m)    Body mass index is 65 kg/m.  Physical Exam   NAD, masked HEENT - [place]/AT, sclera anicteric, PERRL, EOMI, conj - non-inj'ed, TM's and canals clear, pharynx clear Neck - supple, no adenopathy, no TM, carotids 2+ and = without bruits bilat Car - RRR without m/g/r Pulm- RR and effort normal at rest, CTA without wheeze or rales Abd - soft, NT, ND, BS+,  no masses Back - no CVA tenderness Skin- no rash noted, no new lesions of concern noted on exposed areas, patient denied otherwise Ext - no LE edema, no active joints Neuro/psychiatric - affect was not flat, appropriate with conversation  Alert and oriented  Grossly non-focal - good strength on testing extremities, sensation intact to LT in distal extremities  Speech and gait are normal   Results for orders placed or performed in visit on 11/02/19  Cytology - PAP  Result Value Ref Range   Neisseria Gonorrhea Negative    Chlamydia Negative    Adequacy      Satisfactory for evaluation; transformation zone component PRESENT.   Diagnosis      - Negative for intraepithelial lesion or malignancy (NILM)   Comment Normal Reference Ranger Chlamydia - Negative    Comment      Normal Reference Range Neisseria Gonorrhea - Negative       Assessment & Plan:   1. Morbid obesity (HCC) Spent a lot of time with the patient today discussing the importance of trying to reduce weight, and the health benefits from that.  Did note it is often more than just trying a medicine, and discussed bariatric medicine centers that can be helpful  reaching the ultimate goal of weight loss.  They include nutrition, physical activity levels, cognitive components, potential medicines and potential surgery options.  I do think this would be a great resource and help to her presently, and did recommend this and a referral was provided after she agreed to pursue.  [facility], and there are others available in this area, including within the [facility] [place] [facility], and it is up to her as to her preference, I did note on a referral to [place] in, and may want to see the [facility] system due to that. Did discuss medications today including a medicine like Ozempic.  Did note it is an injectable medicine, although has been helpful with weight loss, and is when I would recommend.  It is similar to the [place] product, and may be a cost issue as well.  I did note some coupons that are available that can be helpful, and she will look into that product and cost with her insurance. Noted other medicines available with the amphetamine product not preferred due to some palpitation history, and she did not want to entertain these medicines.  Another medicine entertained previously was a bupropion product and discussed this medicine with her today as well, and noted it is probably not quite as helpful as the Ozempic product.  Did note the one medicine that was recently taken off the market as well due to cancer concerns. She did not feel previous nutritional input was helpful. Did agree with getting more physically active, and do feel  she can do so and like having the trainer involved, and slowly increasing physical activity levels emphasized and if developing symptoms, needs to stop and follow-up.  Did note increasing physical activity levels will be important to helping with weight loss.  Also dietary modifications important.  - Amb Referral to Bariatric Medicine  2. Mixed hyperlipidemia She did have a top normal LDL and slightly low HDL  noted on her lipid panel in April, and otherwise her metabolic panel and blood counts were normal.  3. Anxiety Denies use of BuSpar product to help presently  4. Gastroesophageal reflux disease without esophagitis Does use an omeprazole product presently to help.   Spent a lot of time in discussion today with the global approach to weight loss and medicines can be an important component of the overall regimen to help. She will check into the Ozempic product with her insurance, and pending cost, will let me know if she wants to proceed with that.  I did note it is an injectable product. Did put the referral through to the bariatric medicine group within [facility] as I do think that will be helpful.     Jamelle Haring, MD 03/02/20 2:39 PM

## 2020-03-02 ENCOUNTER — Encounter: Payer: Self-pay | Admitting: Internal Medicine

## 2020-03-02 ENCOUNTER — Ambulatory Visit (INDEPENDENT_AMBULATORY_CARE_PROVIDER_SITE_OTHER): Payer: Medicaid Other | Admitting: Internal Medicine

## 2020-03-02 ENCOUNTER — Other Ambulatory Visit: Payer: Self-pay

## 2020-03-02 DIAGNOSIS — K219 Gastro-esophageal reflux disease without esophagitis: Secondary | ICD-10-CM

## 2020-03-02 DIAGNOSIS — F419 Anxiety disorder, unspecified: Secondary | ICD-10-CM

## 2020-03-02 DIAGNOSIS — E782 Mixed hyperlipidemia: Secondary | ICD-10-CM | POA: Insufficient documentation

## 2020-03-02 NOTE — Patient Instructions (Signed)
Referral placed to bariatric medicine  Medicine to consider - Semiglutide (Ozempic), can research cost and if covered by insurance

## 2020-03-06 ENCOUNTER — Telehealth: Payer: Self-pay

## 2020-03-06 NOTE — Telephone Encounter (Signed)
Error

## 2020-03-08 ENCOUNTER — Telehealth: Payer: Self-pay | Admitting: Family Medicine

## 2020-03-08 NOTE — Telephone Encounter (Signed)
Spoke with patient and informed that we received a response from her insurance company on today and they have denied the prior authorization for ozempic.  Patient verbalized understanding.

## 2020-03-08 NOTE — Telephone Encounter (Signed)
Copied from CRM [id number]. Topic: General - Inquiry >> Mar 02, 2020  4:36 PM Adrian Prince D wrote: Reason for CRM: Patient called and said that she needs a prior auth for the insurance from her insurance. She asked that Crystal handle this for her. Please advise     >> Mar 08, 2020  3:34 PM Laural Benes, [place] C wrote: Pt called back in to check the status of PA for medication Ozempic.(weight loss medication)  Pt says that she received a Rx for medication but is unable to have filled due to PA needed.    Please assist pt further.

## 2020-03-15 NOTE — Progress Notes (Signed)
Patient ID: Patricia Dickerson, female    DOB: Sep 03, 2000, 19 y.o.   MRN: [id number]  PCP: Alba Cory, MD  Chief Complaint  Patient presents with  . Anxiety    Subjective:   Patricia Dickerson is a 19 y.o. female, presents to clinic with CC of the following:  Chief Complaint  Patient presents with  . Anxiety    HPI:  Patient is a 19 year old female patient of Dr. Carlynn Purl My last visit with her was 03/02/2020 At that visit, it was agreed to pursue a referral to a bariatric center to help with weight loss. Follows up today with anxiety concerns.  To review her morbid obesity status:  She has continued to gain weight, and last visit noted she was anxious to start a medication to help.  She noted one was prescribed in the past, and then was not approved, and then thinks was finally approved although they never had it available to pick up at the pharmacy and she did not start.  She did not know the name of that medicine.  She did note that Saxenda (liraglutide) was recommended and was not covered with her insurance and too expensive to start.  These medicines were recommended by Irving Burton, provider here previously. She notes she has seen nutrition in the past, always they wanted her to do is write down what she was eating all day.  She states she now eats only once a day, although is not very strict with her diet. Her mom has a trainer that she wants to start working with her daughter. The patient has a history of PFO her mom's input, and did see cardiology in the past, and was told there were no concerns that needed further follow-up.  She notes she has a history of palpitations, although have not been problematic in the recent past.  She denies any history of chest pains, shortness of breath, increased lower extremity swelling.  She is not physically active, although has been trying to walk more and denied any increased symptoms with the exertion.   She works in [employer]. She had no history of  higher blood pressures, diabetes, and did have labs done in April which were reviewed.  Also denies any history of thyroid disease. Wt Readings from Last 3 Encounters:  03/16/20 (!) 488 lb 1.6 oz (221.4 kg)  03/02/20 (!) 492 lb 11.2 oz (223.5 kg)  11/02/19 (!) 473 lb (214.6 kg)   Today noted her weight had decreased about 4 pounds since 03/02/2020 She notes she has increased her activity levels, exercising in the water, and also watching her diet better, and has tried an OTC supplement to help in recent past as well  Anxiety Medication regimen -was prescribed BuSpar in the past, and noted at first it was helpful, although more recently it has not been as helpful, and does not take regularly, takes more intermittently.  She states that her anxiety has been more problematic in the recent past, with her mind frequently racing, feeling more anxious about her health and other issues, denies frank panic attacks, although for a week, she had some worsening palpitations and heart racing feelings, although that has improved in the more recent past.  No frank chest pains or shortness of breath. She notes the anxiety is not disabling, although has been a little more of an issue here recently. Her PHQ-9 and GAD-7 screens were reviewed today. She denied any history of thyroid disease, no anemia history.  She  notes her reflux is a little more exacerbated with her anxiety being up, and has run out of her omeprazole product, and requests a refill on that.  That is helpful when she has reflux issues. She only takes her ibuprofen at times with her cycle when she has discomfort, does not take it regularly.   Patient Active Problem List   Diagnosis Date Noted  . Mixed hyperlipidemia 03/02/2020  . Family history of breast cancer 11/02/2019  . Dysmenorrhea 11/02/2019  . Vitamin D deficiency   . Anxiety 10/04/2018  . Severe obesity due to excess calories without serious comorbidity with body mass index (BMI)  greater than 99th percentile for age in pediatric patient (HCC) 11/10/2016  . Headache in front of head 01/12/2014  . Gastroesophageal reflux 12/26/2013      Current Outpatient Medications:  .  busPIRone (BUSPAR) 7.5 MG tablet, Take 1 tablet once daily at night, may take 1 add'l tab during the day as needed for anxiety., Disp: 45 tablet, Rfl: 1 .  ibuprofen (ADVIL) 800 MG tablet, Take 1 tablet (800 mg total) by mouth every 8 (eight) hours as needed., Disp: 30 tablet, Rfl: 0 .  omeprazole (PRILOSEC) 40 MG capsule, Take 1 capsule (40 mg total) by mouth daily., Disp: 90 capsule, Rfl: 1   Allergies  Allergen Reactions  . Amoxicillin Hives and Rash     Past Surgical History:  Procedure Laterality Date  . MOUTH SURGERY       Family History  Problem Relation Age of Onset  . Thyroid disease Mother   . Diabetes Father   . Breast cancer Paternal Aunt        5660s  . Breast cancer Paternal Grandmother        not sure of age     Social History   Tobacco Use  . Smoking status: Never Smoker  . Smokeless tobacco: Never Used  Substance Use Topics  . Alcohol use: Not Currently    With staff assistance, above reviewed with the patient today.  ROS: As per HPI, otherwise no specific complaints on a limited and focused system review   No results found for this or any previous visit (from the past 72 hour(s)).   PHQ2/9: [facility] 03/16/2020 03/02/2020 03/02/2020 09/14/2019 08/11/2019  Decreased Interest 0 0 0 0 0  Down, Depressed, Hopeless 0 0 0 0 0  PHQ - 2 Score 0 0 0 0 0  Altered sleeping - 0 - 0 0  Tired, decreased energy - 0 - 0 0  Change in appetite - 0 - 0 0  Feeling bad or failure about yourself  - 0 - 0 0  Trouble concentrating - 0 - 0 0  Moving slowly or fidgety/restless - 0 - 0 0  Suicidal thoughts - 0 - 0 0  PHQ-9 Score - 0 - 0 0  Difficult doing work/chores - - - Not difficult at all Not difficult at all  Some recent data might be hidden   PHQ-2/9  Result is neg  GAD 7 : Generalized Anxiety Score 03/16/2020 10/04/2018  Nervous, Anxious, on Edge 0 2  Control/stop worrying 0 0  Worry too much - different things 0 2  Trouble relaxing 0 0  Restless 0 0  Easily annoyed or irritable 0 0  Afraid - awful might happen 0 0  Total GAD 7 Score 0 4  Anxiety Difficulty - Somewhat difficult   GAD7 reviewed   Fall Risk: Fall Risk  03/16/2020  03/02/2020 09/14/2019 08/11/2019 12/16/2018  Falls in the past year? 0 0 1 1 0  Number falls in past yr: 0 0 0 0 0  Injury with Fall? 0 0 1 0 0  Follow up - - Falls evaluation completed - -      Objective:   Vitals:   03/16/20 0957  BP: 130/70  Pulse: 88  Resp: 16  Temp: 98.1 F (36.7 C)  TempSrc: Oral  SpO2: 96%  Weight: (!) 488 lb 1.6 oz (221.4 kg)  Height: 6\' 1"  (1.854 m)    Body mass index is 64.4 kg/m.  Physical Exam   NAD, masked, very pleasant HEENT - [place]/AT, sclera anicteric, PERRL, EOMI, conj - non-inj'ed,  Neck - supple, no adenopathy, no obvious TM,  Car - RRR without m/g/r Pulm- RR and effort normal at rest, CTA without wheeze or rales Abd - soft, obese, NT diffusely,  Back - no CVA tenderness Ext - no LE edema,  Neuro/psychiatric - affect was not flat, appropriate with conversation             Alert and oriented, speech was normal             Able to get up from the chair and up the step onto the exam table without assistance  Results for orders placed or performed in visit on 11/02/19  Cytology - PAP  Result Value Ref Range   Neisseria Gonorrhea Negative    Chlamydia Negative    Adequacy      Satisfactory for evaluation; transformation zone component PRESENT.   Diagnosis      - Negative for intraepithelial lesion or malignancy (NILM)   Comment Normal Reference Ranger Chlamydia - Negative    Comment      Normal Reference Range Neisseria Gonorrhea - Negative       Assessment & Plan:   1. Anxiety She notes anxiety has been up some in the recent past, but with  her GAD-7 reviewed and without marked concerns.  She denies frank panic attacks.  Noted the BuSpar was initially helpful, although in the recent past has really not noticed any difference when taking the medicine. Discussed other occasions that can be helpful for generalized anxiety, and agreed to try Lexapro-10 mg daily.  We will start with half of a tablet for the first week.  I did note sometimes can bother the stomach some initially with some upset stomach and nausea, and usually if it does, goes away in the first week or 2. Noting some of the recent palpitation issues, felt best to check a TSH and free T4, and also a CBC to make sure she is not anemic.  Her last CBC was normal about 6 months ago.  - TSH + free T4 - CBC with Differential/Platelet - escitalopram (LEXAPRO) 10 MG tablet; Take 1 tablet (10 mg total) by mouth daily. Take 1/2 tablet for first week  Dispense: 90 tablet; Refill: 1  2. Morbid obesity (HCC) As discussed previously, recommended a bariatric medicine referral and in progress.  She has increased her physical activities, with exercise in the water, and noted that is a great exercise to do presently.  Encouraged continuing to stay more active, and continue with dietary modifications, and she has had some success in the recent past noted as above.  3. Palpitations As above noted, will check some labs today as well. Continue to monitor.  - TSH + free T4 - CBC with Differential/Platelet  4. Gastroesophageal reflux disease  without esophagitis Okay to refill the omeprazole Discussed the importance of not taking higher dose anti-inflammatories on a regular basis, and she does not presently. - omeprazole (PRILOSEC) 40 MG capsule; Take 1 capsule (40 mg total) by mouth daily.  Dispense: 90 capsule; Refill: 1  Tentatively, we will schedule follow-up in approximately 5 to 6 weeks time to assess how she is doing on the Lexapro product, and can follow-up sooner as  needed.      Jamelle Haring, MD 03/16/20 9:59 AM

## 2020-03-16 ENCOUNTER — Ambulatory Visit: Payer: Medicaid Other | Admitting: Internal Medicine

## 2020-03-16 ENCOUNTER — Encounter: Payer: Self-pay | Admitting: Internal Medicine

## 2020-03-16 ENCOUNTER — Other Ambulatory Visit: Payer: Self-pay

## 2020-03-16 VITALS — BP 130/70 | HR 88 | Temp 98.1°F | Resp 16 | Ht 73.0 in | Wt >= 6400 oz

## 2020-03-16 DIAGNOSIS — K219 Gastro-esophageal reflux disease without esophagitis: Secondary | ICD-10-CM | POA: Diagnosis not present

## 2020-03-16 DIAGNOSIS — R002 Palpitations: Secondary | ICD-10-CM

## 2020-03-16 DIAGNOSIS — F419 Anxiety disorder, unspecified: Secondary | ICD-10-CM | POA: Diagnosis not present

## 2020-03-16 MED ORDER — OMEPRAZOLE 40 MG PO CPDR: 40.0000 mg | DELAYED_RELEASE_CAPSULE | Freq: Every day | ORAL | 1 refills | Status: AC

## 2020-03-16 MED ORDER — ESCITALOPRAM OXALATE 10 MG PO TABS: 10.0000 mg | ORAL_TABLET | Freq: Every day | ORAL | 1 refills | Status: AC

## 2020-03-17 LAB — CBC WITH DIFFERENTIAL/PLATELET
Absolute Monocytes: 481 cells/uL (ref 200–950)
Basophils Absolute: 52 cells/uL (ref 0–200)
Basophils Relative: 0.8 %
Eosinophils Absolute: 91 cells/uL (ref 15–500)
Eosinophils Relative: 1.4 %
HCT: 43.9 % (ref 35.0–45.0)
Hemoglobin: 14.3 g/dL (ref 11.7–15.5)
Lymphs Abs: 2301 cells/uL (ref 850–3900)
MCH: 29.7 pg (ref 27.0–33.0)
MCHC: 32.6 g/dL (ref 32.0–36.0)
MCV: 91.1 fL (ref 80.0–100.0)
MPV: 11.1 fL (ref 7.5–12.5)
Monocytes Relative: 7.4 %
Neutro Abs: 3575 cells/uL (ref 1500–7800)
Neutrophils Relative %: 55 %
Platelets: 366 10*3/uL (ref 140–400)
RBC: 4.82 10*6/uL (ref 3.80–5.10)
RDW: 12 % (ref 11.0–15.0)
Total Lymphocyte: 35.4 %
WBC: 6.5 10*3/uL (ref 3.8–10.8)

## 2020-03-17 LAB — TSH+FREE T4: TSH W/REFLEX TO FT4: 1.99 mIU/L

## 2020-04-28 ENCOUNTER — Other Ambulatory Visit: Payer: Self-pay | Admitting: Obstetrics and Gynecology

## 2020-04-28 DIAGNOSIS — N946 Dysmenorrhea, unspecified: Secondary | ICD-10-CM

## 2020-04-30 ENCOUNTER — Other Ambulatory Visit: Payer: Self-pay | Admitting: Family Medicine

## 2020-04-30 DIAGNOSIS — N946 Dysmenorrhea, unspecified: Secondary | ICD-10-CM

## 2020-04-30 NOTE — Telephone Encounter (Signed)
Medication Refill - Medication: ibuprofen  Has the patient contacted their pharmacy? Yes.   (Agent: If no, request that the patient contact the pharmacy for the refill.) (Agent: If yes, when and what did the pharmacy advise?)  Preferred Pharmacy (with phone number or street name): [id number] - [place], [place] - [address] AT [address] OF [address]  Agent: Please be advised that RX refills may take up to 3 business days. We ask that you follow-up with your pharmacy.

## 2020-05-08 DIAGNOSIS — J069 Acute upper respiratory infection, unspecified: Secondary | ICD-10-CM | POA: Diagnosis not present

## 2020-05-08 DIAGNOSIS — Z1152 Encounter for screening for COVID-19: Secondary | ICD-10-CM | POA: Diagnosis not present

## 2020-05-11 NOTE — Progress Notes (Deleted)
Name: Patricia Dickerson   MRN: [id number]    DOB: 2001/03/22   Date:05/11/2020       Progress Note  Subjective  Chief Complaint  Follow Up  HPI  Contraception: she states her cycles are heavy and painful, she would like to discuss options. Explained that because of her weight ocp will likely not protect her from pregnancy. Discussed Depo and Nexplanon. She would like to try IUD   GERD: she states she needs to take Omeprazole daily, she ran out of medication for the past 6 months and has noticed food getting  stuck in her throat again , also used to have regurgitation and burning, sometimes nausea.   Anxiety: she has a long history of anxiety , never seen by therapist, takes buspar prn, she would like to see someone in person  Morbid obesity: going on for years, gained more weight since last visit about 40 lbs, discussed life style modification  Vaginal odor: going on since last year, we will check for BV, no pain, she has white to clear discharged, she has been sexually active in the past, needs to be check sti also   Right foot pain: after a fall, getting better, she wants to hold off on seeing podiatrist at this time  Patient Active Problem List   Diagnosis Date Noted  . Palpitations 03/16/2020  . Mixed hyperlipidemia 03/02/2020  . Family history of breast cancer 11/02/2019  . Dysmenorrhea 11/02/2019  . Vitamin D deficiency   . Anxiety 10/04/2018  . Severe obesity due to excess calories without serious comorbidity with body mass index (BMI) greater than 99th percentile for age in pediatric patient (HCC) 11/10/2016  . Headache in front of head 01/12/2014  . Gastroesophageal reflux 12/26/2013    Past Surgical History:  Procedure Laterality Date  . MOUTH SURGERY      Family History  Problem Relation Age of Onset  . Thyroid disease Mother   . Diabetes Father   . Breast cancer Paternal Aunt        52s  . Breast cancer Paternal Grandmother        not sure of age    Social  History   Tobacco Use  . Smoking status: Never Smoker  . Smokeless tobacco: Never Used  Substance Use Topics  . Alcohol use: Not Currently     Current Outpatient Medications:  .  busPIRone (BUSPAR) 7.5 MG tablet, Take 1 tablet once daily at night, may take 1 add'l tab during the day as needed for anxiety., Disp: 45 tablet, Rfl: 1 .  escitalopram (LEXAPRO) 10 MG tablet, Take 1 tablet (10 mg total) by mouth daily. Take 1/2 tablet for first week, Disp: 90 tablet, Rfl: 1 .  ibuprofen (ADVIL) 800 MG tablet, Take 1 tablet (800 mg total) by mouth every 8 (eight) hours as needed., Disp: 30 tablet, Rfl: 0 .  omeprazole (PRILOSEC) 40 MG capsule, Take 1 capsule (40 mg total) by mouth daily., Disp: 90 capsule, Rfl: 1  Allergies  Allergen Reactions  . Amoxicillin Hives and Rash    I personally reviewed {Reviewed:14835} with the patient/caregiver today.   ROS  ***  Objective  There were no vitals filed for this visit.  There is no height or weight on file to calculate BMI.  Physical Exam ***  Recent Results (from the past 2160 hour(s))  TSH + free T4     Status: None   Collection Time: 03/16/20 10:16 AM  Result Value Ref Range  TSH W/REFLEX TO FT4 1.99 mIU/L    Comment:           Reference Range .           > or = 20 Years  0.40-4.50 .                Pregnancy Ranges           First trimester    0.26-2.66           Second trimester   0.55-2.73           Third trimester    0.43-2.91   CBC with Differential/Platelet     Status: None   Collection Time: 03/16/20 10:16 AM  Result Value Ref Range   WBC 6.5 3.8 - 10.8 Thousand/uL   RBC 4.82 3.80 - 5.10 Million/uL   Hemoglobin 14.3 11.7 - 15.5 g/dL   HCT 40.1 02.7 - 25.3 %   MCV 91.1 80.0 - 100.0 fL   MCH 29.7 27.0 - 33.0 pg   MCHC 32.6 32.0 - 36.0 g/dL   RDW 66.4 40.3 - 47.4 %   Platelets 366 140 - 400 Thousand/uL   MPV 11.1 7.5 - 12.5 fL   Neutro Abs 3,575 1,500 - 7,800 cells/uL   Lymphs Abs 2,301 850 - 3,900 cells/uL    Absolute Monocytes 481 200 - 950 cells/uL   Eosinophils Absolute 91 15 - 500 cells/uL   Basophils Absolute 52 0 - 200 cells/uL   Neutrophils Relative % 55 %   Total Lymphocyte 35.4 %   Monocytes Relative 7.4 %   Eosinophils Relative 1.4 %   Basophils Relative 0.8 %    Diabetic Foot Exam: Diabetic Foot Exam - Simple   No data filed    ***  PHQ2/9: Depression screen [facility] 2/9 03/16/2020 03/02/2020 03/02/2020 09/14/2019 08/11/2019  Decreased Interest 0 0 0 0 0  Down, Depressed, Hopeless 0 0 0 0 0  PHQ - 2 Score 0 0 0 0 0  Altered sleeping - 0 - 0 0  Tired, decreased energy - 0 - 0 0  Change in appetite - 0 - 0 0  Feeling bad or failure about yourself  - 0 - 0 0  Trouble concentrating - 0 - 0 0  Moving slowly or fidgety/restless - 0 - 0 0  Suicidal thoughts - 0 - 0 0  PHQ-9 Score - 0 - 0 0  Difficult doing work/chores - - - Not difficult at all Not difficult at all  Some recent data might be hidden    phq 9 is {gen pos QVZ:563875} ***  Fall Risk: Fall Risk  03/16/2020 03/02/2020 09/14/2019 08/11/2019 12/16/2018  Falls in the past year? 0 0 1 1 0  Number falls in past yr: 0 0 0 0 0  Injury with Fall? 0 0 1 0 0  Follow up - - Falls evaluation completed - -   ***   Functional Status Survey:   ***   Assessment & Plan  *** There are no diagnoses linked to this encounter.

## 2020-05-14 ENCOUNTER — Ambulatory Visit: Payer: Medicaid Other | Admitting: Family Medicine

## 2020-07-01 DIAGNOSIS — Z20822 Contact with and (suspected) exposure to covid-19: Secondary | ICD-10-CM | POA: Diagnosis not present

## 2020-07-12 DIAGNOSIS — J069 Acute upper respiratory infection, unspecified: Secondary | ICD-10-CM | POA: Diagnosis not present

## 2020-07-12 DIAGNOSIS — M791 Myalgia, unspecified site: Secondary | ICD-10-CM | POA: Diagnosis not present

## 2020-07-12 DIAGNOSIS — Z1152 Encounter for screening for COVID-19: Secondary | ICD-10-CM | POA: Diagnosis not present

## 2020-07-13 DIAGNOSIS — J0300 Acute streptococcal tonsillitis, unspecified: Secondary | ICD-10-CM | POA: Diagnosis not present

## 2020-07-13 DIAGNOSIS — R509 Fever, unspecified: Secondary | ICD-10-CM | POA: Diagnosis not present

## 2020-07-13 DIAGNOSIS — Z1152 Encounter for screening for COVID-19: Secondary | ICD-10-CM | POA: Diagnosis not present

## 2020-07-13 DIAGNOSIS — R0602 Shortness of breath: Secondary | ICD-10-CM | POA: Diagnosis not present

## 2020-10-15 ENCOUNTER — Other Ambulatory Visit: Payer: Self-pay | Admitting: Obstetrics and Gynecology

## 2020-10-15 DIAGNOSIS — N946 Dysmenorrhea, unspecified: Secondary | ICD-10-CM

## 2020-12-30 DIAGNOSIS — H01005 Unspecified blepharitis left lower eyelid: Secondary | ICD-10-CM | POA: Diagnosis not present

## 2021-03-19 DIAGNOSIS — M5441 Lumbago with sciatica, right side: Secondary | ICD-10-CM | POA: Diagnosis not present

## 2021-03-19 DIAGNOSIS — J069 Acute upper respiratory infection, unspecified: Secondary | ICD-10-CM | POA: Diagnosis not present

## 2021-04-08 ENCOUNTER — Telehealth: Payer: Self-pay

## 2021-04-08 DIAGNOSIS — Z880 Allergy status to penicillin: Secondary | ICD-10-CM | POA: Diagnosis not present

## 2021-04-08 DIAGNOSIS — Z7722 Contact with and (suspected) exposure to environmental tobacco smoke (acute) (chronic): Secondary | ICD-10-CM | POA: Diagnosis not present

## 2021-04-08 DIAGNOSIS — G4730 Sleep apnea, unspecified: Secondary | ICD-10-CM | POA: Diagnosis not present

## 2021-04-08 DIAGNOSIS — Z79899 Other long term (current) drug therapy: Secondary | ICD-10-CM | POA: Diagnosis not present

## 2021-04-08 DIAGNOSIS — R0789 Other chest pain: Secondary | ICD-10-CM | POA: Diagnosis not present

## 2021-04-08 DIAGNOSIS — R0689 Other abnormalities of breathing: Secondary | ICD-10-CM | POA: Diagnosis not present

## 2021-04-08 DIAGNOSIS — Z7982 Long term (current) use of aspirin: Secondary | ICD-10-CM | POA: Diagnosis not present

## 2021-04-08 NOTE — Telephone Encounter (Signed)
Tried to contact pt no answer and was not able to leave vm. Need to schedule appt    Copied from CRM [id number]. Topic: General - Other >> Apr 08, 2021 12:02 PM Pawlus, Maxine Glenn A wrote: Reason for CRM: Pt needed some advice regarding seeing a Cardiologist, please advise if pt would need a referral or if Dr Carlynn Purl has any suggestions on where the pt should go.

## 2021-04-09 ENCOUNTER — Telehealth: Payer: Self-pay

## 2021-04-09 NOTE — Telephone Encounter (Signed)
Transition Care Management Unsuccessful Follow-up Telephone Call  Date of discharge and from where:  04/08/2021-[facility]  Attempts:  1st Attempt  Reason for unsuccessful TCM follow-up call:  Unable to reach patient

## 2021-04-09 NOTE — Progress Notes (Deleted)
Name: Patricia Dickerson   MRN: [id number]    DOB: 2000-09-13   Date:04/09/2021       Progress Note  Subjective  Chief Complaint  Annual Exam  HPI  Patient presents for annual CPE.  Diet: *** Exercise: ***   Flowsheet Row Office Visit from 09/14/2019 in [facility]  AUDIT-C Score 0      Depression: Phq 9 is  {Desc; negative/positive:13464} Depression screen [facility] 2/9 03/16/2020 03/02/2020 03/02/2020 09/14/2019 08/11/2019  Decreased Interest 0 0 0 0 0  Down, Depressed, Hopeless 0 0 0 0 0  PHQ - 2 Score 0 0 0 0 0  Altered sleeping - 0 - 0 0  Tired, decreased energy - 0 - 0 0  Change in appetite - 0 - 0 0  Feeling bad or failure about yourself  - 0 - 0 0  Trouble concentrating - 0 - 0 0  Moving slowly or fidgety/restless - 0 - 0 0  Suicidal thoughts - 0 - 0 0  PHQ-9 Score - 0 - 0 0  Difficult doing work/chores - - - Not difficult at all Not difficult at all  Some recent data might be hidden   Hypertension: BP Readings from Last 3 Encounters:  03/16/20 130/70  03/02/20 120/60  11/02/19 120/72   Obesity: Wt Readings from Last 3 Encounters:  03/16/20 (!) 488 lb 1.6 oz (221.4 kg)  03/02/20 (!) 492 lb 11.2 oz (223.5 kg)  11/02/19 (!) 473 lb (214.6 kg)   BMI Readings from Last 3 Encounters:  03/16/20 64.40 kg/m  03/02/20 65.00 kg/m  11/02/19 62.40 kg/m     Vaccines:  HPV: up to at age 19 , ask insurance if age between 98-45  Shingrix: 57-64 yo and ask insurance if covered when patient above 5 yo Pneumonia: educated and discussed with patient. Flu: educated and discussed with patient.  Hep C Screening: N/A STD testing and prevention (HIV/chl/gon/syphilis): 09/14/19 Intimate partner violence: negative Sexual History : Menstrual History/LMP/Abnormal Bleeding:  Incontinence Symptoms:   Breast cancer:  - Last Mammogram: N/A - BRCA gene screening: N/A  Osteoporosis: Discussed high calcium and vitamin D supplementation, weight bearing  exercises  Cervical cancer screening: 11/02/19  Skin cancer: Discussed monitoring for atypical lesions  Colorectal cancer: N/A   Lung cancer: Low Dose CT Chest recommended if Age 77-80 years, 20 pack-year currently smoking OR have quit w/in 15years. Patient does not qualify.   ECG: N/A  Advanced Care Planning: A voluntary discussion about advance care planning including the explanation and discussion of advance directives.  Discussed health care proxy and Living will, and the patient was able to identify a health care proxy as ***.  Patient does not have a living will at present time. If patient does have living will, I have requested they bring this to the clinic to be scanned in to their chart.  Lipids: Lab Results  Component Value Date   CHOL 156 09/14/2019   Lab Results  Component Value Date   HDL 37 (L) 09/14/2019   Lab Results  Component Value Date   LDLCALC 100 (H) 09/14/2019   Lab Results  Component Value Date   TRIG 96 09/14/2019   Lab Results  Component Value Date   CHOLHDL 4.2 09/14/2019   No results found for: LDLDIRECT  Glucose: Glucose, Bld  Date Value Ref Range Status  09/14/2019 99 65 - 99 mg/dL Final    Comment:    .  Fasting reference interval .   06/22/2018 87 65 - 99 mg/dL Final    Comment:    .            Fasting reference interval .     Patient Active Problem List   Diagnosis Date Noted   Palpitations 03/16/2020   Mixed hyperlipidemia 03/02/2020   Family history of breast cancer 11/02/2019   Dysmenorrhea 11/02/2019   Vitamin D deficiency    Anxiety 10/04/2018   Severe obesity due to excess calories without serious comorbidity with body mass index (BMI) greater than 99th percentile for age in pediatric patient ([place]) 11/10/2016   Headache in front of head 01/12/2014   Gastroesophageal reflux 12/26/2013    Past Surgical History:  Procedure Laterality Date   MOUTH SURGERY      Family History  Problem Relation Age of Onset    Thyroid disease Mother    Diabetes Father    Breast cancer Paternal Aunt        65s   Breast cancer Paternal Grandmother        not sure of age    Social History   Socioeconomic History   Marital status: Single    Spouse name: Not on file   Number of children: 0   Years of education: Not on file   Highest education level: Not on file  Occupational History   Not on file  Tobacco Use   Smoking status: Never   Smokeless tobacco: Never  Vaping Use   Vaping Use: Never used  Substance and Sexual Activity   Alcohol use: Not Currently   Drug use: Never   Sexual activity: Not Currently    Partners: Male    Birth control/protection: None  Other Topics Concern   Not on file  Social History Narrative   Not on file   Social Determinants of Health   Financial Resource Strain: Not on file  Food Insecurity: Not on file  Transportation Needs: Not on file  Physical Activity: Not on file  Stress: Not on file  Social Connections: Not on file  Intimate Partner Violence: Not on file     Current Outpatient Medications:    busPIRone (BUSPAR) 7.5 MG tablet, Take 1 tablet once daily at night, may take 1 add'l tab during the day as needed for anxiety., Disp: 45 tablet, Rfl: 1   escitalopram (LEXAPRO) 10 MG tablet, Take 1 tablet (10 mg total) by mouth daily. Take 1/2 tablet for first week, Disp: 90 tablet, Rfl: 1   ibuprofen (ADVIL) 800 MG tablet, Take 1 tablet (800 mg total) by mouth every 8 (eight) hours as needed., Disp: 30 tablet, Rfl: 0   omeprazole (PRILOSEC) 40 MG capsule, Take 1 capsule (40 mg total) by mouth daily., Disp: 90 capsule, Rfl: 1  Allergies  Allergen Reactions   Amoxicillin Hives and Rash     ROS  ***  Objective  There were no vitals filed for this visit.  There is no height or weight on file to calculate BMI.  Physical Exam ***  No results found for this or any previous visit (from the past 2160 hour(s)).   Fall Risk: Fall Risk  03/16/2020  03/02/2020 09/14/2019 08/11/2019 12/16/2018  Falls in the past year? 0 0 1 1 0  Number falls in past yr: 0 0 0 0 0  Injury with Fall? 0 0 1 0 0  Follow up - - Falls evaluation completed - -     Functional Status Survey:  Assessment & Plan  1. Well adult exam ***   -USPSTF grade A and B recommendations reviewed with patient; age-appropriate recommendations, preventive care, screening tests, etc discussed and encouraged; healthy living encouraged; see AVS for patient education given to patient -Discussed importance of 150 minutes of physical activity weekly, eat two servings of fish weekly, eat one serving of tree nuts ( cashews, pistachios, pecans, almonds.Marland Kitchen) every other day, eat 6 servings of fruit/vegetables daily and drink plenty of water and avoid sweet beverages.   -Reviewed Health Maintenance: ***

## 2021-04-10 ENCOUNTER — Other Ambulatory Visit: Payer: Self-pay

## 2021-04-10 ENCOUNTER — Encounter: Payer: Medicaid Other | Admitting: Family Medicine

## 2021-04-10 ENCOUNTER — Emergency Department (HOSPITAL_COMMUNITY): Payer: Medicaid Other

## 2021-04-10 ENCOUNTER — Emergency Department (HOSPITAL_COMMUNITY)
Admission: EM | Admit: 2021-04-10 | Discharge: 2021-04-10 | Disposition: A | Discharge status: Home / Self Care | Payer: Medicaid Other | Attending: Emergency Medicine | Admitting: Emergency Medicine

## 2021-04-10 ENCOUNTER — Encounter: Payer: Self-pay | Admitting: Family Medicine

## 2021-04-10 ENCOUNTER — Encounter (HOSPITAL_COMMUNITY): Payer: Self-pay | Admitting: *Deleted

## 2021-04-10 DIAGNOSIS — R030 Elevated blood-pressure reading, without diagnosis of hypertension: Secondary | ICD-10-CM | POA: Diagnosis not present

## 2021-04-10 DIAGNOSIS — R0789 Other chest pain: Secondary | ICD-10-CM | POA: Diagnosis not present

## 2021-04-10 DIAGNOSIS — R0602 Shortness of breath: Secondary | ICD-10-CM | POA: Diagnosis not present

## 2021-04-10 DIAGNOSIS — Z0000 Encounter for general adult medical examination without abnormal findings: Secondary | ICD-10-CM

## 2021-04-10 DIAGNOSIS — G4739 Other sleep apnea: Secondary | ICD-10-CM | POA: Diagnosis not present

## 2021-04-10 DIAGNOSIS — R002 Palpitations: Secondary | ICD-10-CM | POA: Diagnosis not present

## 2021-04-10 LAB — CBC
HCT: 45.1 % (ref 36.0–46.0)
Hemoglobin: 15.3 g/dL — ABNORMAL HIGH (ref 12.0–15.0)
MCH: 30.5 pg (ref 26.0–34.0)
MCHC: 33.9 g/dL (ref 30.0–36.0)
MCV: 89.8 fL (ref 80.0–100.0)
Platelets: 406 10*3/uL — ABNORMAL HIGH (ref 150–400)
RBC: 5.02 MIL/uL (ref 3.87–5.11)
RDW: 12.1 % (ref 11.5–15.5)
WBC: 9 10*3/uL (ref 4.0–10.5)
nRBC: 0 % (ref 0.0–0.2)

## 2021-04-10 LAB — BASIC METABOLIC PANEL
Anion gap: 9 (ref 5–15)
BUN: 10 mg/dL (ref 6–20)
CO2: 24 mmol/L (ref 22–32)
Calcium: 9.5 mg/dL (ref 8.9–10.3)
Chloride: 104 mmol/L (ref 98–111)
Creatinine, Ser: 0.89 mg/dL (ref 0.44–1.00)
GFR, Estimated: >60 mL/min (ref >60)
Glucose, Bld: 93 mg/dL (ref 70–99)
Potassium: 4.3 mmol/L (ref 3.5–5.1)
Sodium: 137 mmol/L (ref 135–145)

## 2021-04-10 LAB — I-STAT BETA HCG BLOOD, ED (MC, WL, AP ONLY): I-stat hCG, quantitative: <5 m[IU]/mL (ref <5)

## 2021-04-10 LAB — TROPONIN I (HIGH SENSITIVITY): Troponin I (High Sensitivity): 3 ng/L (ref <18)

## 2021-04-10 MED ORDER — HYDROXYZINE HCL 25 MG PO TABS: 25.0000 mg | ORAL_TABLET | Freq: Four times a day (QID) | ORAL | 0 refills | Status: AC

## 2021-04-10 NOTE — Telephone Encounter (Signed)
Transition Care Management Follow-up Telephone Call Date of discharge and from where: 04/08/2021 from [facility] How have you been since you were released from the hospital?  Any questions or concerns? No  Items Reviewed: Did the pt receive and understand the discharge instructions provided? Yes  Medications obtained and verified? Yes  Other? No  Any new allergies since your discharge? No  Dietary orders reviewed? No Do you have support at home? No   Functional Questionnaire: (I = Independent and D = Dependent) ADLs: I  Bathing/Dressing- I  Meal Prep- I  Eating- I  Maintaining continence- I  Transferring/Ambulation- I  Managing Meds- I   Follow up appointments reviewed:  PCP Hospital f/u appt confirmed? Yes  Scheduled to see Ellwood Dense, DO on 04/15/2021 @ 3:00pm. Specialist Hospital f/u appt confirmed? No   Are transportation arrangements needed? No  If their condition worsens, is the pt aware to call PCP or go to the Emergency Dept.? Yes Was the patient provided with contact information for the PCP's office or ED? Yes Was to pt encouraged to call back with questions or concerns? Yes

## 2021-04-10 NOTE — ED Provider Notes (Signed)
[facility] EMERGENCY DEPARTMENT Provider Note   CSN: [id number] Arrival date & time: 04/10/21  1507     History Chief Complaint  Patient presents with   Chest Pain    Patricia Dickerson is a 20 y.o. female with a past medical history significant for vitamin D deficiency, anxiety, morbid obesity, GERD, and history of palpitations who presents to the ED due to palpitations.  Patient admits to intermittent palpitations for the past 5 days.  Patient states these are different than her typical palpitations.  Patient describes palpitations as her heart skipping a beat.  Denies associated chest pain.  She endorses increased shortness of breath.  No history of blood clots.  Denies recent surgeries, recent long immobilizations, and hormonal treatments.  No lower extremity edema.  Patient states she used to have a pediatric cardiologist however, has not establish care with an adult cardiologist.  Denies nausea vomiting.  No diaphoresis.  No cardiac history.  No previous CVA or MI.  No treatment prior to arrival.  No aggravating or alleviating factors.  Chart reviewed.  Patient was evaluated in the ED in 11/14 for the same complaint with reassuring work-up.  Patient shortness of breath thought to be related to sleep apnea.  Outpatient sleep study ordered.  Strict ED precautions discussed with patient. Patient states understanding and agrees to plan. Patient discharged home in no acute distress and stable vitals      Past Medical History:  Diagnosis Date   Vitamin D deficiency     Patient Active Problem List   Diagnosis Date Noted   Palpitations 03/16/2020   Mixed hyperlipidemia 03/02/2020   Family history of breast cancer 11/02/2019   Dysmenorrhea 11/02/2019   Vitamin D deficiency    Anxiety 10/04/2018   Severe obesity due to excess calories without serious comorbidity with body mass index (BMI) greater than 99th percentile for age in pediatric patient (HCC) 11/10/2016    Headache in front of head 01/12/2014   Gastroesophageal reflux 12/26/2013    Past Surgical History:  Procedure Laterality Date   MOUTH SURGERY       OB History     Gravida  0   Para  0   Term  0   Preterm  0   AB  0   Living  0      SAB  0   IAB  0   Ectopic  0   Multiple  0   Live Births  0           Family History  Problem Relation Age of Onset   Thyroid disease Mother    Diabetes Father    Breast cancer Paternal Aunt        62s   Breast cancer Paternal Grandmother        not sure of age    Social History   Tobacco Use   Smoking status: Never   Smokeless tobacco: Never  Vaping Use   Vaping Use: Never used  Substance Use Topics   Alcohol use: Not Currently   Drug use: Never    Home Medications Prior to Admission medications   Medication Sig Start Date End Date Taking? Authorizing Provider  escitalopram (LEXAPRO) 10 MG tablet Take 1 tablet (10 mg total) by mouth daily. Take 1/2 tablet for first week Patient taking differently: Take 10 mg by mouth daily. 03/16/20  Yes Jamelle Haring, MD  omeprazole (PRILOSEC) 40 MG capsule Take 1 capsule (40 mg total) by  mouth daily. 03/16/20  Yes Jamelle Haring, MD  busPIRone (BUSPAR) 7.5 MG tablet Take 1 tablet once daily at night, may take 1 add'l tab during the day as needed for anxiety. Patient not taking: No sig reported 10/04/18   Doren Custard, FNP  ibuprofen (ADVIL) 800 MG tablet Take 1 tablet (800 mg total) by mouth every 8 (eight) hours as needed. Patient not taking: No sig reported 11/02/19   Copland, Alicia B, PA-C    Allergies    Amoxicillin  Review of Systems   Review of Systems  Constitutional:  Negative for chills and fever.  Respiratory:  Positive for shortness of breath. Negative for cough.   Cardiovascular:  Positive for palpitations. Negative for chest pain and leg swelling.  Gastrointestinal:  Negative for abdominal pain, nausea and vomiting.  All other systems  reviewed and are negative.  Physical Exam Updated Vital Signs BP (!) 149/91 (BP Location: Right Arm)   Pulse 79   Temp 98.3 F (36.8 C) (Oral)   Resp 15   Ht 6\' 1"  (1.854 m)   Wt (!) 221.4 kg   LMP 02/23/2021   SpO2 98%   BMI 64.40 kg/m   Physical Exam Vitals and nursing note reviewed.  Constitutional:      General: She is not in acute distress.    Appearance: She is not ill-appearing.  HENT:     Head: Normocephalic.  Eyes:     Pupils: Pupils are equal, round, and reactive to light.  Cardiovascular:     Rate and Rhythm: Normal rate and regular rhythm.     Pulses: Normal pulses.     Heart sounds: Normal heart sounds. No murmur heard.   No friction rub. No gallop.  Pulmonary:     Effort: Pulmonary effort is normal.     Breath sounds: Normal breath sounds.  Abdominal:     General: Abdomen is flat. There is no distension.     Palpations: Abdomen is soft.     Tenderness: There is no abdominal tenderness. There is no guarding or rebound.  Musculoskeletal:        General: Normal range of motion.     Cervical back: Neck supple.     Comments: No lower extremity edema. Negative homan sign. No calf tenderness.  Skin:    General: Skin is warm and dry.  Neurological:     General: No focal deficit present.     Mental Status: She is alert.  Psychiatric:        Mood and Affect: Mood normal.        Behavior: Behavior normal.    ED Results / Procedures / Treatments   Labs (all labs ordered are listed, but only abnormal results are displayed) Labs Reviewed  CBC - Abnormal; Notable for the following components:      Result Value   Hemoglobin 15.3 (*)    Platelets 406 (*)    All other components within normal limits  BASIC METABOLIC PANEL  I-STAT BETA HCG BLOOD, ED ([facility], [facility], [facility] ONLY)  TROPONIN I (HIGH SENSITIVITY)    EKG None  Radiology DG Chest 2 View  Result Date: 04/10/2021 CLINICAL DATA:  Chest discomfort. EXAM: CHEST - 2 VIEW COMPARISON:  October 26, 2014 FINDINGS:  The heart size and mediastinal contours are within normal limits. Both lungs are clear. The visualized skeletal structures are unremarkable. IMPRESSION: No active cardiopulmonary disease. Electronically Signed   By: October 28, 2014 M.D.   On: 04/10/2021 16:21  Procedures Procedures   Medications Ordered in ED Medications - No data to display  ED Course  I have reviewed the triage vital signs and the nursing notes.  Pertinent labs & imaging results that were available during my care of the patient were reviewed by me and considered in my medical decision making (see chart for details).    MDM Rules/Calculators/A&P                           20 year old female presents to the ED due to palpitations associated shortness of breath.  Patient describes shortness of breath as gasping for air while she is sleeping. Patient referred for outpatient sleep study. Patient evaluated in the ED on 11/14 for the same complaint with reassuring work-up.  Upon arrival, patient afebrile, not tachycardic or hypoxic.  Patient initially found to have an elevated BP of 171/105.  No history of hypertension.  Upon recheck BP 149/91. Patient in no acute distress.  Benign physical exam.  No lower extremity edema.  Negative Homans' sign bilaterally.  PERC negative and low risk using Wells criteria.  Low suspicion for PE/DVT.  Cardiac labs ordered at triage.  Suspect palpitations could be related to possible PVCs versus arrhythmias however, EKG here demonstrates normal sinus rhythm , no signs of acute ischemia, and no arrhythmias.  Patient will most likely need cardiology referral for further evaluation.  Troponin normal.  BMP unremarkable.  Normal renal function and no major electrolyte derangements.  CBC reassuring.  No leukocytosis.  Pregnancy test negative.  Chest x-ray personally reviewed which is negative for signs of pneumonia, pneumothorax, or widened mediastinum.  EKG demonstrates normal sinus rhythm with no signs  of acute ischemia.  Low suspicion for ACS. Cardiology referral given to patient at discharge.  Advised patient to have BP rechecked by PCP within 1 week. Strict ED precautions discussed with patient. Patient states understanding and agrees to plan. Patient discharged home in no acute distress and stable vitals. Final Clinical Impression(s) / ED Diagnoses Final diagnoses:  Palpitation  Elevated blood pressure reading    Rx / DC Orders ED Discharge Orders     None        Jesusita Oka 04/10/21 1734    Gerhard Munch, MD 04/10/21 1743

## 2021-04-10 NOTE — Discharge Instructions (Addendum)
It was a pleasure taking care of you today.  As discussed, all of your labs were reassuring.  Your cardiac markers were normal.  Your EKG was normal.  I have included the number of the cardiologist.  Please call to schedule an appointment for further evaluation.  Your blood pressure was mildly elevated while you were here in the ER.  Please have your blood pressure rechecked within 1 week by your PCP.  Return to the ER for new or worsening symptoms.

## 2021-04-10 NOTE — ED Triage Notes (Signed)
The pt has been c/o chest pain since Saturday  she was seen [facility] and worked up for the same

## 2021-04-10 NOTE — ED Provider Notes (Signed)
Emergency Medicine Provider Triage Evaluation Note  Patricia Dickerson , a 20 y.o. female  was evaluated in triage.  Pt complains of chest discomfort that has been coming and going for the past few days.  Was evaluated for this at another emergency department and told that everything was okay.  On Saturday she began to have the same type of discomfort that worsened today so she decided to come to the emergency department.  Review of Systems  Positive: Chest discomfort and lightheadedness Negative: Shortness of breath  Physical Exam  BP (!) 171/105 (BP Location: Right Wrist)   Pulse 82   Temp 98.3 F (36.8 C) (Oral)   Resp 20   Ht 6\' 1"  (1.854 m)   Wt (!) 221.4 kg   LMP 02/23/2021   SpO2 100%   BMI 64.40 kg/m  Gen:   Awake, no distress   Resp:  Normal effort  MSK:   Moves extremities without difficulty  Other:  RRR,CTAB  Medical Decision Making  Medically screening exam initiated at 3:32 PM.  Appropriate orders placed.  ANYELINA CLAYCOMB was informed that the remainder of the evaluation will be completed by another provider, this initial triage assessment does not replace that evaluation, and the importance of remaining in the ED until their evaluation is complete.     [name], PA-C 04/10/21 1533    04/12/21, DO 04/10/21 04/12/21

## 2021-04-11 ENCOUNTER — Telehealth: Payer: Self-pay

## 2021-04-11 NOTE — Telephone Encounter (Signed)
Transition Care Management Follow-up Telephone Call Date of discharge and from where: 04/10/2021 from [facility] How have you been since you were released from the hospital? Pt stated that she is feeling better and did not have any questions or concerns.  Any questions or concerns? No  Items Reviewed: Did the pt receive and understand the discharge instructions provided? Yes  Medications obtained and verified? Yes  Other? No  Any new allergies since your discharge? No  Dietary orders reviewed? No Do you have support at home? Yes   Functional Questionnaire: (I = Independent and D = Dependent) ADLs: I  Bathing/Dressing- I  Meal Prep- I  Eating- I  Maintaining continence- I  Transferring/Ambulation- I  Managing Meds- I   Follow up appointments reviewed:  PCP Hospital f/u appt confirmed? No   Specialist Hospital f/u appt confirmed? No  Pt is calling cardiology today for a ED follow up appt.  Are transportation arrangements needed? No  If their condition worsens, is the pt aware to call PCP or go to the Emergency Dept.? Yes Was the patient provided with contact information for the PCP's office or ED? Yes Was to pt encouraged to call back with questions or concerns? Yes

## 2021-04-15 ENCOUNTER — Encounter: Payer: Medicaid Other | Admitting: Family Medicine

## 2021-04-17 DIAGNOSIS — H6691 Otitis media, unspecified, right ear: Secondary | ICD-10-CM | POA: Diagnosis not present

## 2021-04-17 DIAGNOSIS — J019 Acute sinusitis, unspecified: Secondary | ICD-10-CM | POA: Diagnosis not present

## 2021-04-24 ENCOUNTER — Encounter: Payer: Medicaid Other | Admitting: Family Medicine

## 2021-05-03 ENCOUNTER — Ambulatory Visit: Payer: Medicaid Other | Admitting: Cardiovascular Disease

## 2021-05-09 DIAGNOSIS — Z880 Allergy status to penicillin: Secondary | ICD-10-CM | POA: Diagnosis not present

## 2021-05-09 DIAGNOSIS — Z20822 Contact with and (suspected) exposure to covid-19: Secondary | ICD-10-CM | POA: Diagnosis not present

## 2021-05-09 DIAGNOSIS — Z7722 Contact with and (suspected) exposure to environmental tobacco smoke (acute) (chronic): Secondary | ICD-10-CM | POA: Diagnosis not present

## 2021-05-09 DIAGNOSIS — R1030 Lower abdominal pain, unspecified: Secondary | ICD-10-CM | POA: Diagnosis not present

## 2021-05-09 DIAGNOSIS — R509 Fever, unspecified: Secondary | ICD-10-CM | POA: Diagnosis not present

## 2021-05-09 DIAGNOSIS — Z7982 Long term (current) use of aspirin: Secondary | ICD-10-CM | POA: Diagnosis not present

## 2021-05-09 DIAGNOSIS — R197 Diarrhea, unspecified: Secondary | ICD-10-CM | POA: Diagnosis not present

## 2021-05-11 DIAGNOSIS — R110 Nausea: Secondary | ICD-10-CM | POA: Diagnosis not present

## 2021-05-11 DIAGNOSIS — H6693 Otitis media, unspecified, bilateral: Secondary | ICD-10-CM | POA: Diagnosis not present

## 2021-05-13 ENCOUNTER — Telehealth: Payer: Self-pay

## 2021-05-13 NOTE — Telephone Encounter (Signed)
Transition Care Management Follow-up Telephone Call Date of discharge and from where: 05/10/2021 from [facility] How have you been since you were released from the hospital? Pt stated that she is feeling better and did not have any questions or concerns at this time.  Any questions or concerns? No  Items Reviewed: Did the pt receive and understand the discharge instructions provided? Yes  Medications obtained and verified? Yes  Other? No  Any new allergies since your discharge? No  Dietary orders reviewed? No Do you have support at home? Yes   Functional Questionnaire: (I = Independent and D = Dependent) ADLs: I  Bathing/Dressing- I  Meal Prep- I  Eating- I  Maintaining continence- I  Transferring/Ambulation- I  Managing Meds- I   Follow up appointments reviewed:  PCP Hospital f/u appt confirmed? No  Pt stated that she will call PCP office for follow up appt.  Specialist Hospital f/u appt confirmed? No  Are transportation arrangements needed? No  If their condition worsens, is the pt aware to call PCP or go to the Emergency Dept.? Yes Was the patient provided with contact information for the PCP's office or ED? Yes Was to pt encouraged to call back with questions or concerns? Yes

## 2021-05-17 DIAGNOSIS — Z6841 Body Mass Index (BMI) 40.0 and over, adult: Secondary | ICD-10-CM | POA: Diagnosis not present

## 2021-05-17 DIAGNOSIS — Z20822 Contact with and (suspected) exposure to covid-19: Secondary | ICD-10-CM | POA: Diagnosis not present

## 2021-05-17 DIAGNOSIS — R42 Dizziness and giddiness: Secondary | ICD-10-CM | POA: Diagnosis not present

## 2021-05-17 DIAGNOSIS — N926 Irregular menstruation, unspecified: Secondary | ICD-10-CM | POA: Diagnosis not present

## 2021-05-17 DIAGNOSIS — R197 Diarrhea, unspecified: Secondary | ICD-10-CM | POA: Diagnosis not present

## 2021-05-17 DIAGNOSIS — Z880 Allergy status to penicillin: Secondary | ICD-10-CM | POA: Diagnosis not present

## 2021-05-17 DIAGNOSIS — R110 Nausea: Secondary | ICD-10-CM | POA: Diagnosis not present

## 2021-05-17 DIAGNOSIS — Z7982 Long term (current) use of aspirin: Secondary | ICD-10-CM | POA: Diagnosis not present

## 2021-05-18 ENCOUNTER — Telehealth: Payer: Self-pay

## 2021-05-18 NOTE — Telephone Encounter (Signed)
Transition Care Management Follow-up Telephone Call Date of discharge and from where: 05/17/2021-[facility]  How have you been since you were released from the hospital? Patient stated she is doing fine.  Any questions or concerns? No  Items Reviewed: Did the pt receive and understand the discharge instructions provided? Yes  Medications obtained and verified?  No new medications given at discharge  Other? No  Any new allergies since your discharge? No  Dietary orders reviewed? No Do you have support at home? Yes   Home Care and Equipment/Supplies: Were home health services ordered? not applicable If so, what is the name of the agency? N/A  Has the agency set up a time to come to the patient's home? not applicable Were any new equipment or medical supplies ordered?  No What is the name of the medical supply agency? N/A Were you able to get the supplies/equipment? not applicable Do you have any questions related to the use of the equipment or supplies? No  Functional Questionnaire: (I = Independent and D = Dependent) ADLs: I  Bathing/Dressing- I  Meal Prep- I  Eating- I  Maintaining continence- I  Transferring/Ambulation- I  Managing Meds- I  Follow up appointments reviewed:  PCP Hospital f/u appt confirmed? Yes  Scheduled to see Dr. Caralee Ates on 05/21/2021 @ 3:20pm. Specialist Hospital f/u appt confirmed? No   Are transportation arrangements needed? No  If their condition worsens, is the pt aware to call PCP or go to the Emergency Dept.? Yes Was the patient provided with contact information for the PCP's office or ED? Yes Was to pt encouraged to call back with questions or concerns? Yes

## 2021-05-21 ENCOUNTER — Inpatient Hospital Stay: Payer: Medicaid Other | Admitting: Internal Medicine

## 2021-05-21 NOTE — Progress Notes (Incomplete)
Established Patient Office Visit  Subjective:  Patient ID: Patricia Dickerson, female    DOB: 06/15/2000  Age: 20 y.o. MRN: [id number]  CC: No chief complaint on file.   HPI Patricia Dickerson presents for ER FU.  Discharge Date: 05/17/21 Hospital/facility: [facility] Diagnosis: Dizziness, abnormal periods Procedures/tests: COVID/Flu negative, labs reviewed Consultants: None New medications: None Discontinued medications: None Discharge instructions:  *** Status: {Blank multiple:19196::"better","worse","stable","fluctuating"}   Past Medical History:  Diagnosis Date   Vitamin D deficiency     Past Surgical History:  Procedure Laterality Date   MOUTH SURGERY      Family History  Problem Relation Age of Onset   Thyroid disease Mother    Diabetes Father    Breast cancer Paternal Aunt        32s   Breast cancer Paternal Grandmother        not sure of age    Social History   Socioeconomic History   Marital status: Single    Spouse name: Not on file   Number of children: 0   Years of education: Not on file   Highest education level: Not on file  Occupational History   Not on file  Tobacco Use   Smoking status: Never   Smokeless tobacco: Never  Vaping Use   Vaping Use: Never used  Substance and Sexual Activity   Alcohol use: Not Currently   Drug use: Never   Sexual activity: Not Currently    Partners: Male    Birth control/protection: None  Other Topics Concern   Not on file  Social History Narrative   Not on file   Social Determinants of Health   Financial Resource Strain: Not on file  Food Insecurity: Not on file  Transportation Needs: Not on file  Physical Activity: Not on file  Stress: Not on file  Social Connections: Not on file  Intimate Partner Violence: Not on file    Outpatient Medications Prior to Visit  Medication Sig Dispense Refill   busPIRone (BUSPAR) 7.5 MG tablet Take 1 tablet once daily at night, may take 1 add'l tab during the day as  needed for anxiety. (Patient not taking: No sig reported) 45 tablet 1   escitalopram (LEXAPRO) 10 MG tablet Take 1 tablet (10 mg total) by mouth daily. Take 1/2 tablet for first week (Patient taking differently: Take 10 mg by mouth daily.) 90 tablet 1   hydrOXYzine (ATARAX/VISTARIL) 25 MG tablet Take 1 tablet (25 mg total) by mouth every 6 (six) hours. 12 tablet 0   ibuprofen (ADVIL) 800 MG tablet Take 1 tablet (800 mg total) by mouth every 8 (eight) hours as needed. (Patient not taking: No sig reported) 30 tablet 0   omeprazole (PRILOSEC) 40 MG capsule Take 1 capsule (40 mg total) by mouth daily. 90 capsule 1   No facility-administered medications prior to visit.    Allergies  Allergen Reactions   Amoxicillin Hives and Rash    ROS Review of Systems    Objective:    Physical Exam  There were no vitals taken for this visit. Wt Readings from Last 3 Encounters:  04/10/21 (!) 488 lb 1.6 oz (221.4 kg)  03/16/20 (!) 488 lb 1.6 oz (221.4 kg)  03/02/20 (!) 492 lb 11.2 oz (223.5 kg)     Health Maintenance Due  Topic Date Due   COVID-19 Vaccine (1) Never done   Hepatitis C Screening  Never done   HPV VACCINES (3 - 3-dose series) 01/29/2018  INFLUENZA VACCINE  12/24/2020       Topic Date Due   HPV VACCINES (3 - 3-dose series) 01/29/2018    No results found for: TSH Lab Results  Component Value Date   WBC 9.0 04/10/2021   HGB 15.3 (H) 04/10/2021   HCT 45.1 04/10/2021   MCV 89.8 04/10/2021   PLT 406 (H) 04/10/2021   Lab Results  Component Value Date   NA 137 04/10/2021   K 4.3 04/10/2021   CO2 24 04/10/2021   GLUCOSE 93 04/10/2021   BUN 10 04/10/2021   CREATININE 0.89 04/10/2021   BILITOT 0.4 09/14/2019   AST 14 09/14/2019   ALT 18 09/14/2019   PROT 6.6 09/14/2019   CALCIUM 9.5 04/10/2021   ANIONGAP 9 04/10/2021   Lab Results  Component Value Date   CHOL 156 09/14/2019   Lab Results  Component Value Date   HDL 37 (L) 09/14/2019   Lab Results  Component  Value Date   LDLCALC 100 (H) 09/14/2019   Lab Results  Component Value Date   TRIG 96 09/14/2019   Lab Results  Component Value Date   CHOLHDL 4.2 09/14/2019   Lab Results  Component Value Date   HGBA1C 5.5 09/14/2019      Assessment & Plan:   Problem List Items Addressed This Visit   None   No orders of the defined types were placed in this encounter.   Follow-up: No follow-ups on file.    Margarita Mail, DO

## 2021-05-28 ENCOUNTER — Other Ambulatory Visit: Payer: Self-pay

## 2021-05-28 ENCOUNTER — Encounter: Payer: Self-pay | Admitting: Internal Medicine

## 2021-05-28 ENCOUNTER — Ambulatory Visit (INDEPENDENT_AMBULATORY_CARE_PROVIDER_SITE_OTHER): Payer: Medicaid Other | Admitting: Internal Medicine

## 2021-05-28 VITALS — BP 126/68 | HR 97 | Temp 98.7°F | Resp 16 | Ht 73.0 in | Wt >= 6400 oz

## 2021-05-28 DIAGNOSIS — R1084 Generalized abdominal pain: Secondary | ICD-10-CM | POA: Diagnosis not present

## 2021-05-28 DIAGNOSIS — K625 Hemorrhage of anus and rectum: Secondary | ICD-10-CM | POA: Diagnosis not present

## 2021-05-28 DIAGNOSIS — R42 Dizziness and giddiness: Secondary | ICD-10-CM | POA: Diagnosis not present

## 2021-05-28 DIAGNOSIS — N939 Abnormal uterine and vaginal bleeding, unspecified: Secondary | ICD-10-CM | POA: Diagnosis not present

## 2021-05-28 LAB — POCT URINALYSIS DIPSTICK
Bilirubin, UA: NEGATIVE
Glucose, UA: NEGATIVE
Ketones, UA: NEGATIVE
Leukocytes, UA: NEGATIVE
Nitrite, UA: NEGATIVE
Protein, UA: POSITIVE — AB
Spec Grav, UA: 1.02 (ref 1.010–1.025)
Urobilinogen, UA: 0.2 E.U./dL
pH, UA: 7.5 (ref 5.0–8.0)

## 2021-05-28 NOTE — Patient Instructions (Addendum)
It was great seeing you today!  Plan discussed at today's visit: -Blood work ordered today, results will be uploaded to [place].  -Continue oral hydration -Will test stool to make sure there is no blood  Follow up in: 1-2 weeks  Take care and let us know if you have any questions or concerns prior to your next visit.  Dr. Rosana Berger

## 2021-05-28 NOTE — Progress Notes (Signed)
Established Patient Office Visit  Subjective:  Patient ID: Patricia Dickerson, female    DOB: November 17, 2000  Age: 21 y.o. MRN: [id number]  CC:  Chief Complaint  Patient presents with   Hospitalization Follow-up    Dizziness has not improved.    HPI Patricia Dickerson presents for ER FU. Complaining of dizziness, diarrhea after completing antibiotic. LMP beginning of November, some mild spotting, December 13 felt nauseous after eating, had diarrhea and fever for 1-2 days. Went to ER, blood work normal, gave Lomotil. Diarrhea continued until 12/23 with nausea and lightheaded. Now after eating will feel light headed, weak. Also having some bright red blood on toilet paper, not in bowl and not on stool. Lower abdominal pain, nausea. Going on for entire month of December.   Discharge Date: 05/17/21 Hospital/facility: [facility] Diagnosis: Dizziness Procedures/tests: CBC, CMP, TSH normal, COVID negative Consultants: None New medications: None, 1 L NS Discontinued medications: None Status: worse  DIZZINESS Duration: 3 weeks Description of symptoms: lightheaded, shaky Duration of episode: 30 minutes to 1 hour Dizziness frequency: no history of the same Provoking factors:  eating Triggered by rolling over in bed: no Triggered by bending over: no Aggravated by head movement: no Aggravated by exertion, coughing, loud noises: no Recent head injury: no History of vasovagal episodes: no Nausea: yes Vomiting: yes Tinnitus: no Hearing loss: no Aural fullness: no Headache: yes Unsteady gait: no Postural instability: no Diplopia, dysarthria, dysphagia or weakness: no Related to exertion: no Pallor: no Diaphoresis: no Dyspnea: no Chest pain: no  Past Medical History:  Diagnosis Date   Vitamin D deficiency     Past Surgical History:  Procedure Laterality Date   MOUTH SURGERY      Family History  Problem Relation Age of Onset   Thyroid disease Mother    Diabetes Father    Breast cancer  Paternal Aunt        42s   Breast cancer Paternal Grandmother        not sure of age    Social History   Socioeconomic History   Marital status: Single    Spouse name: Not on file   Number of children: 0   Years of education: Not on file   Highest education level: Not on file  Occupational History   Not on file  Tobacco Use   Smoking status: Never   Smokeless tobacco: Never  Vaping Use   Vaping Use: Never used  Substance and Sexual Activity   Alcohol use: Not Currently   Drug use: Never   Sexual activity: Not Currently    Partners: Male    Birth control/protection: None  Other Topics Concern   Not on file  Social History Narrative   Not on file   Social Determinants of Health   Financial Resource Strain: Not on file  Food Insecurity: Not on file  Transportation Needs: Not on file  Physical Activity: Not on file  Stress: Not on file  Social Connections: Not on file  Intimate Partner Violence: Not on file    Outpatient Medications Prior to Visit  Medication Sig Dispense Refill   busPIRone (BUSPAR) 7.5 MG tablet Take 1 tablet once daily at night, may take 1 add'l tab during the day as needed for anxiety. (Patient not taking: No sig reported) 45 tablet 1   escitalopram (LEXAPRO) 10 MG tablet Take 1 tablet (10 mg total) by mouth daily. Take 1/2 tablet for first week (Patient taking differently: Take 10 mg by mouth  daily.) 90 tablet 1   hydrOXYzine (ATARAX/VISTARIL) 25 MG tablet Take 1 tablet (25 mg total) by mouth every 6 (six) hours. 12 tablet 0   ibuprofen (ADVIL) 800 MG tablet Take 1 tablet (800 mg total) by mouth every 8 (eight) hours as needed. (Patient not taking: No sig reported) 30 tablet 0   omeprazole (PRILOSEC) 40 MG capsule Take 1 capsule (40 mg total) by mouth daily. 90 capsule 1   No facility-administered medications prior to visit.    Allergies  Allergen Reactions   Amoxicillin Hives and Rash    ROS Review of Systems  Constitutional:  Positive  for appetite change and fatigue. Negative for chills and fever.  Eyes:  Negative for visual disturbance.  Respiratory:  Negative for cough and shortness of breath.   Cardiovascular:  Negative for chest pain and palpitations.  Gastrointestinal:  Positive for abdominal pain, blood in stool and nausea. Negative for diarrhea and vomiting.  Endocrine: Negative for polydipsia and polyuria.  Genitourinary:  Negative for dysuria, frequency, hematuria and urgency.  Neurological:  Positive for weakness, light-headedness and headaches.     Objective:    Physical Exam Constitutional:      Appearance: Normal appearance. She is obese.  HENT:     Head: Normocephalic and atraumatic.  Eyes:     Conjunctiva/sclera: Conjunctivae normal.  Cardiovascular:     Rate and Rhythm: Normal rate and regular rhythm.  Pulmonary:     Effort: Pulmonary effort is normal.     Breath sounds: Normal breath sounds.  Abdominal:     General: Bowel sounds are normal. There is no distension.     Palpations: Abdomen is soft.     Tenderness: There is no abdominal tenderness. There is no right CVA tenderness, left CVA tenderness or guarding.  Musculoskeletal:     Right lower leg: No edema.     Left lower leg: No edema.  Skin:    General: Skin is warm and dry.     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     Mental Status: She is alert. Mental status is at baseline.    BP 126/68    Pulse 97    Temp 98.7 F (37.1 C) (Oral)    Resp 16    Ht 6\' 1"  (1.854 m)    Wt (!) 495 lb (224.5 kg)    SpO2 98%    BMI 65.31 kg/m  Wt Readings from Last 3 Encounters:  04/10/21 (!) 488 lb 1.6 oz (221.4 kg)  03/16/20 (!) 488 lb 1.6 oz (221.4 kg)  03/02/20 (!) 492 lb 11.2 oz (223.5 kg)     Health Maintenance Due  Topic Date Due   COVID-19 Vaccine (1) Never done   Hepatitis C Screening  Never done   HPV VACCINES (3 - 3-dose series) 01/29/2018   INFLUENZA  VACCINE  12/24/2020       Topic Date Due   HPV VACCINES (3 - 3-dose series) 01/29/2018    No results found for: TSH Lab Results  Component Value Date   WBC 9.0 04/10/2021   HGB 15.3 (H) 04/10/2021   HCT 45.1 04/10/2021   MCV 89.8 04/10/2021   PLT 406 (H) 04/10/2021   Lab Results  Component Value Date   NA 137 04/10/2021   K 4.3 04/10/2021   CO2 24 04/10/2021   GLUCOSE 93 04/10/2021   BUN 10 04/10/2021   CREATININE 0.89 04/10/2021  BILITOT 0.4 09/14/2019   AST 14 09/14/2019   ALT 18 09/14/2019   PROT 6.6 09/14/2019   CALCIUM 9.5 04/10/2021   ANIONGAP 9 04/10/2021   Lab Results  Component Value Date   CHOL 156 09/14/2019   Lab Results  Component Value Date   HDL 37 (L) 09/14/2019   Lab Results  Component Value Date   LDLCALC 100 (H) 09/14/2019   Lab Results  Component Value Date   TRIG 96 09/14/2019   Lab Results  Component Value Date   CHOLHDL 4.2 09/14/2019   Lab Results  Component Value Date   HGBA1C 5.5 09/14/2019      Assessment & Plan:   1. Abnormal vaginal bleeding/Blood per rectum: Uncertain if bleeding is vaginal or rectal. POC heme occult ordered. Repeat CBC, CMP ordered as well to rule out anemia, dehydration.   - CBC w/Diff/Platelet - COMPLETE METABOLIC PANEL WITH GFR  2. Lightheadedness/Generalized abdominal pain: UA to rule out infection, blood or glucose. 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 FU in 1-2 weeks for recheck.   - HgB A1c - POCT Urinalysis Dipstick  Follow-up: Return in about 2 weeks (around 06/11/2021).    Teodora Medici, DO

## 2021-05-29 ENCOUNTER — Telehealth: Payer: Self-pay

## 2021-05-29 LAB — CBC WITH DIFFERENTIAL/PLATELET
Absolute Monocytes: 576 cells/uL (ref 200–950)
Basophils Absolute: 54 cells/uL (ref 0–200)
Basophils Relative: 0.6 %
Eosinophils Absolute: 90 cells/uL (ref 15–500)
Eosinophils Relative: 1 %
HCT: 42.2 % (ref 35.0–45.0)
Hemoglobin: 14.3 g/dL (ref 11.7–15.5)
Lymphs Abs: 3240 cells/uL (ref 850–3900)
MCH: 30.2 pg (ref 27.0–33.0)
MCHC: 33.9 g/dL (ref 32.0–36.0)
MCV: 89 fL (ref 80.0–100.0)
MPV: 11.7 fL (ref 7.5–12.5)
Monocytes Relative: 6.4 %
Neutro Abs: 5040 cells/uL (ref 1500–7800)
Neutrophils Relative %: 56 %
Platelets: 374 10*3/uL (ref 140–400)
RBC: 4.74 10*6/uL (ref 3.80–5.10)
RDW: 12.1 % (ref 11.0–15.0)
Total Lymphocyte: 36 %
WBC: 9 10*3/uL (ref 3.8–10.8)

## 2021-05-29 LAB — COMPLETE METABOLIC PANEL WITH GFR
AG Ratio: 1.7 (calc) (ref 1.0–2.5)
ALT: 17 U/L (ref 6–29)
AST: 16 U/L (ref 10–30)
Albumin: 4.6 g/dL (ref 3.6–5.1)
Alkaline phosphatase (APISO): 62 U/L (ref 31–125)
BUN: 11 mg/dL (ref 7–25)
CO2: 26 mmol/L (ref 20–32)
Calcium: 10.2 mg/dL (ref 8.6–10.2)
Chloride: 107 mmol/L (ref 98–110)
Creat: 0.84 mg/dL (ref 0.50–0.96)
Globulin: 2.7 g/dL (calc) (ref 1.9–3.7)
Glucose, Bld: 88 mg/dL (ref 65–99)
Potassium: 4.5 mmol/L (ref 3.5–5.3)
Sodium: 141 mmol/L (ref 135–146)
Total Bilirubin: 0.5 mg/dL (ref 0.2–1.2)
Total Protein: 7.3 g/dL (ref 6.1–8.1)
eGFR: 101 mL/min/{1.73_m2} (ref >=60)

## 2021-05-29 LAB — HEMOGLOBIN A1C
Hgb A1c MFr Bld: 5.6 % of total Hgb (ref <5.7)
Mean Plasma Glucose: 114 mg/dL
eAG (mmol/L): 6.3 mmol/L

## 2021-05-29 NOTE — Telephone Encounter (Signed)
Pt notified labs normal.

## 2021-05-29 NOTE — Telephone Encounter (Signed)
Copied from [place] [phone]. Topic: General - Other >> May 29, 2021  9:04 AM Tessa Lerner A wrote: Reason for CRM: The patient would like to be contacted by a member of clinical staff when possible  The patient would like to discuss their most recent lab results from 05/28/21  Please contact further when available

## 2021-05-30 ENCOUNTER — Encounter: Payer: Medicaid Other | Admitting: Family Medicine

## 2021-05-30 ENCOUNTER — Ambulatory Visit: Payer: Medicaid Other | Admitting: Cardiovascular Disease

## 2021-06-02 DIAGNOSIS — R42 Dizziness and giddiness: Secondary | ICD-10-CM | POA: Diagnosis not present

## 2021-06-02 DIAGNOSIS — Z20822 Contact with and (suspected) exposure to covid-19: Secondary | ICD-10-CM | POA: Diagnosis not present

## 2021-06-02 DIAGNOSIS — R519 Headache, unspecified: Secondary | ICD-10-CM | POA: Diagnosis not present

## 2021-06-02 DIAGNOSIS — R6883 Chills (without fever): Secondary | ICD-10-CM | POA: Diagnosis not present

## 2021-06-03 ENCOUNTER — Telehealth: Payer: Self-pay

## 2021-06-03 NOTE — Telephone Encounter (Signed)
Transition Care Management Unsuccessful Follow-up Telephone Call  Date of discharge and from where:  06/02/2021 from [facility]  Attempts:  1st Attempt  Reason for unsuccessful TCM follow-up call:  Left voice message

## 2021-06-04 NOTE — Telephone Encounter (Signed)
Transition Care Management Follow-up Telephone Call Date of discharge and from where: 06/02/2021 from [facility] How have you been since you were released from the hospital? Pt stated that she is feeling much better and did not have any question or concerns at this time.  Any questions or concerns? No  Items Reviewed: Did the pt receive and understand the discharge instructions provided? Yes  Medications obtained and verified? Yes  Other? No  Any new allergies since your discharge? No  Dietary orders reviewed? No Do you have support at home? Yes   Functional Questionnaire: (I = Independent and D = Dependent) ADLs: I  Bathing/Dressing- I  Meal Prep- I  Eating- I  Maintaining continence- I  Transferring/Ambulation- I  Managing Meds- I   Follow up appointments reviewed:  PCP Hospital f/u appt confirmed? No   Specialist Hospital f/u appt confirmed? No   Are transportation arrangements needed? No  If their condition worsens, is the pt aware to call PCP or go to the Emergency Dept.? Yes Was the patient provided with contact information for the PCP's office or ED? Yes Was to pt encouraged to call back with questions or concerns? Yes

## 2021-06-17 ENCOUNTER — Encounter: Payer: Self-pay | Admitting: Advanced Practice Midwife

## 2021-06-17 ENCOUNTER — Ambulatory Visit (INDEPENDENT_AMBULATORY_CARE_PROVIDER_SITE_OTHER): Payer: Medicaid Other | Admitting: Advanced Practice Midwife

## 2021-06-17 ENCOUNTER — Ambulatory Visit: Payer: Medicaid Other | Admitting: Internal Medicine

## 2021-06-17 ENCOUNTER — Ambulatory Visit (INDEPENDENT_AMBULATORY_CARE_PROVIDER_SITE_OTHER): Payer: Medicaid Other

## 2021-06-17 ENCOUNTER — Other Ambulatory Visit: Payer: Self-pay

## 2021-06-17 VITALS — BP 136/80 | Ht 74.0 in | Wt >= 6400 oz

## 2021-06-17 DIAGNOSIS — K625 Hemorrhage of anus and rectum: Secondary | ICD-10-CM | POA: Diagnosis not present

## 2021-06-17 DIAGNOSIS — N926 Irregular menstruation, unspecified: Secondary | ICD-10-CM

## 2021-06-17 LAB — POC HEMOCCULT BLD/STL (HOME/3-CARD/SCREEN)
Card #2 Fecal Occult Blod, POC: NEGATIVE
Card #3 Fecal Occult Blood, POC: NEGATIVE
Fecal Occult Blood, POC: NEGATIVE

## 2021-06-17 NOTE — Addendum Note (Signed)
Addended by: Docia Furl on: 06/17/2021 04:42 PM   Modules accepted: Orders

## 2021-06-17 NOTE — Patient Instructions (Signed)
Dysfunctional Uterine Bleeding Dysfunctional uterine bleeding is abnormal bleeding from the uterus. Dysfunctional uterine bleeding includes: A menstrual period that comes earlier or later than usual. A menstrual period that is lighter or heavier than usual, or has large blood clots. Vaginal bleeding between menstrual periods. Skipping one or more menstrual periods. Vaginal bleeding after sex. Vaginal bleeding after menopause. Follow these instructions at home: Eating and drinking  Eat well-balanced meals. Include foods that are high in iron, such as liver, meat, shellfish, green leafy vegetables, and eggs. To prevent or treat constipation, your health care provider may recommend that you: Drink enough fluid to keep your urine pale yellow. Take over-the-counter or prescription medicines. Eat foods that are high in fiber, such as beans, whole grains, and fresh fruits and vegetables. Limit foods that are high in fat and processed sugars, such as fried or sweet foods. Medicines Take over-the-counter and prescription medicines only as told by your health care provider. Do not change medicines without talking with your health care provider. Aspirin or medicines that contain aspirin may make the bleeding worse. Do not take those medicines: During the week before your menstrual period. During your menstrual period. If you were prescribed iron pills, take them as told by your health care provider. Iron pills help to replace iron that your body loses because of this condition. Activity If you need to change your sanitary pad or tampon more than one time every 2 hours: Lie in bed with your feet raised (elevated). Place a cold pack on your lower abdomen. Rest as much as possible until the bleeding stops or slows down. Do not try to lose weight until the bleeding has stopped and your blood iron level is back to normal. General instructions  For two months, write down: When your menstrual period  starts. When your menstrual period ends. When any abnormal vaginal bleeding occurs. What problems you notice. Keep all follow up visits as told by your health care provider. This is important. Contact a health care provider if you: Feel light-headed or weak. Have nausea and vomiting. Cannot eat or drink without vomiting. Feel dizzy or have diarrhea while you are taking medicines. Are taking birth control pills or hormones, and you want to change them or stop taking them. Get help right away if: You develop a fever or chills. You need to change your sanitary pad or tampon more than one time per hour. Your vaginal bleeding becomes heavier, or your flow contains clots more often. You develop pain in your abdomen. You lose consciousness. You develop a rash. Summary Dysfunctional uterine bleeding is abnormal bleeding from the uterus. It includes menstrual bleeding of abnormal duration, volume, or regularity. Bleeding after sex and after menopause are also considered dysfunctional uterine bleeding. This information is not intended to replace advice given to you by your health care provider. Make sure you discuss any questions you have with your health care provider. Document Revised: 10/21/2017 Document Reviewed: 10/21/2017 Elsevier Patient Education  2022 Elsevier Inc.  

## 2021-06-18 ENCOUNTER — Ambulatory Visit: Payer: Medicaid Other | Admitting: Internal Medicine

## 2021-06-18 DIAGNOSIS — J029 Acute pharyngitis, unspecified: Secondary | ICD-10-CM | POA: Diagnosis not present

## 2021-06-18 DIAGNOSIS — H9201 Otalgia, right ear: Secondary | ICD-10-CM | POA: Diagnosis not present

## 2021-06-18 NOTE — Progress Notes (Signed)
Patient ID: Patricia Dickerson, female   DOB: 07/12/2000, 22 y.o.   MRN: [id number]  Reason for Consult: Menstrual Problem   Referred by Alba Cory, MD  Subjective:  Date of Service: 06/17/2021  HPI:  Patricia Dickerson is a 21 y.o. female being seen for concern of ongoing vaginal bleeding. Her last normal period was in early November. Normally her cycles are every month lasting 5-7 days and the first few days are heavy. Since November she has had every day bleeding that varies from light to heavy. She reports changing menstrual product 2-3 times per day. She had some cramping in November and none since. She denies any major life changes- physical or emotional or increased stress during the same time. A confounding finding is that during the same time she has had rectal bleeding approximately 2 times per week. She has seen Dr Caralee Ates- Internal Medicine. Results of Heme Occult are negative.   The patient declines hormonal treatment for the irregular vaginal bleeding. She does accept a gyn ultrasound. Her last PAP smear was normal 1.5 years ago. She has not been sexually active since then. We did discuss the role of obesity with regards to hormone function.   Past Medical History:  Diagnosis Date   Vitamin D deficiency    Family History  Problem Relation Age of Onset   Thyroid disease Mother    Diabetes Father    Breast cancer Paternal Aunt        67s   Breast cancer Paternal Grandmother        not sure of age   Past Surgical History:  Procedure Laterality Date   MOUTH SURGERY      Short Social History:  Social History   Tobacco Use   Smoking status: Never   Smokeless tobacco: Never  Substance Use Topics   Alcohol use: Not Currently    Allergies  Allergen Reactions   Amoxicillin Hives and Rash    Current Outpatient Medications  Medication Sig Dispense Refill   escitalopram (LEXAPRO) 10 MG tablet Take 1 tablet (10 mg total) by mouth daily. Take 1/2 tablet for first week  (Patient taking differently: Take 10 mg by mouth daily.) 90 tablet 1   ibuprofen (ADVIL) 800 MG tablet Take 1 tablet (800 mg total) by mouth every 8 (eight) hours as needed. 30 tablet 0   omeprazole (PRILOSEC) 40 MG capsule Take 1 capsule (40 mg total) by mouth daily. 90 capsule 1   busPIRone (BUSPAR) 7.5 MG tablet Take 1 tablet once daily at night, may take 1 add'l tab during the day as needed for anxiety. (Patient not taking: Reported on 05/28/2021) 45 tablet 1   hydrOXYzine (ATARAX/VISTARIL) 25 MG tablet Take 1 tablet (25 mg total) by mouth every 6 (six) hours. (Patient not taking: Reported on 05/28/2021) 12 tablet 0   No current facility-administered medications for this visit.    Review of Systems  Constitutional:  Negative for chills and fever.  HENT:  Negative for congestion, ear discharge, ear pain, hearing loss, sinus pain and sore throat.   Eyes:  Negative for blurred vision and double vision.  Respiratory:  Negative for cough, shortness of breath and wheezing.   Cardiovascular:  Negative for chest pain, palpitations and leg swelling.  Gastrointestinal:  Negative for abdominal pain, blood in stool, constipation, diarrhea, heartburn, melena, nausea and vomiting.       Positive for rectal bleeding  Genitourinary:  Negative for dysuria, flank pain, frequency, hematuria and urgency.  Positive for irregular vaginal bleeding  Musculoskeletal:  Negative for back pain, joint pain and myalgias.  Skin:  Negative for itching and rash.  Neurological:  Negative for dizziness, tingling, tremors, sensory change, speech change, focal weakness, seizures, loss of consciousness, weakness and headaches.  Endo/Heme/Allergies:  Negative for environmental allergies. Does not bruise/bleed easily.  Psychiatric/Behavioral:  Negative for depression, hallucinations, memory loss, substance abuse and suicidal ideas. The patient is not nervous/anxious and does not have insomnia.        Objective:  Objective    Vitals:   06/17/21 1529  BP: 136/80  Weight: (!) 490 lb (222.3 kg)  Height: 6\' 2"  (1.88 m)   Body mass index is 62.91 kg/m. Constitutional: Obese female in no acute distress.  HEENT: normal Skin: Warm and dry.  Cardiovascular: Regular rate and rhythm.   Respiratory: Clear to auscultation bilateral. Normal respiratory effort Neuro: DTRs 2+, Cranial nerves grossly intact Psych: Alert and Oriented x3. No memory deficits. Normal mood and affect.  MS: normal gait, normal bilateral lower extremity ROM/strength/stability.  Pelvic exam: (female chaperone present) is limited by body habitus EGBUS: within normal limits Vagina: within normal limits and with normal mucosa, scant brown blood in the vault Cervix: normal appearing of what was visible   Assessment/Plan:     21 y.o. G0 P0 female with irregular and sometimes heavy vaginal bleeding  Gyn ultrasound and follow up visit after Follow up with MD as needed   [facility] 06/18/2021, 2:00 PM

## 2021-06-18 NOTE — Progress Notes (Incomplete)
Established Patient Office Visit  Subjective:  Patient ID: Patricia Dickerson, female    DOB: May 02, 2001  Age: 21 y.o. MRN: [id number]  CC: No chief complaint on file.   HPI LAQUITHA HESLIN presents for follow up on dizziness and abnormal vaginal bleeding. Stool heme occult negative, she was seen by gynecology for vaginal bleeding and was diagnosed with dysfunctional uterine bleeding. Blood work from the beginning of the month was normal, no anemia, no hyperglycemia, A1c normal at 5.6%.   Past Medical History:  Diagnosis Date   Vitamin D deficiency     Past Surgical History:  Procedure Laterality Date   MOUTH SURGERY      Family History  Problem Relation Age of Onset   Thyroid disease Mother    Diabetes Father    Breast cancer Paternal Aunt        36s   Breast cancer Paternal Grandmother        not sure of age    Social History   Socioeconomic History   Marital status: Single    Spouse name: Not on file   Number of children: 0   Years of education: Not on file   Highest education level: Not on file  Occupational History   Not on file  Tobacco Use   Smoking status: Never   Smokeless tobacco: Never  Vaping Use   Vaping Use: Never used  Substance and Sexual Activity   Alcohol use: Not Currently   Drug use: Never   Sexual activity: Not Currently    Partners: Male    Birth control/protection: None  Other Topics Concern   Not on file  Social History Narrative   Not on file   Social Determinants of Health   Financial Resource Strain: Not on file  Food Insecurity: Not on file  Transportation Needs: Not on file  Physical Activity: Not on file  Stress: Not on file  Social Connections: Not on file  Intimate Partner Violence: Not on file    Outpatient Medications Prior to Visit  Medication Sig Dispense Refill   busPIRone (BUSPAR) 7.5 MG tablet Take 1 tablet once daily at night, may take 1 add'l tab during the day as needed for anxiety. (Patient not taking:  Reported on 05/28/2021) 45 tablet 1   escitalopram (LEXAPRO) 10 MG tablet Take 1 tablet (10 mg total) by mouth daily. Take 1/2 tablet for first week (Patient taking differently: Take 10 mg by mouth daily.) 90 tablet 1   hydrOXYzine (ATARAX/VISTARIL) 25 MG tablet Take 1 tablet (25 mg total) by mouth every 6 (six) hours. (Patient not taking: Reported on 05/28/2021) 12 tablet 0   ibuprofen (ADVIL) 800 MG tablet Take 1 tablet (800 mg total) by mouth every 8 (eight) hours as needed. 30 tablet 0   omeprazole (PRILOSEC) 40 MG capsule Take 1 capsule (40 mg total) by mouth daily. 90 capsule 1   No facility-administered medications prior to visit.    Allergies  Allergen Reactions   Amoxicillin Hives and Rash    ROS Review of Systems    Objective:    Physical Exam  There were no vitals taken for this visit. Wt Readings from Last 3 Encounters:  06/17/21 (!) 490 lb (222.3 kg)  05/28/21 (!) 495 lb (224.5 kg)  04/10/21 (!) 488 lb 1.6 oz (221.4 kg)     Health Maintenance Due  Topic Date Due   COVID-19 Vaccine (1) Never done   Hepatitis C Screening  Never done  HPV VACCINES (3 - 3-dose series) 01/29/2018       Topic Date Due   HPV VACCINES (3 - 3-dose series) 01/29/2018    No results found for: TSH Lab Results  Component Value Date   WBC 9.0 05/28/2021   HGB 14.3 05/28/2021   HCT 42.2 05/28/2021   MCV 89.0 05/28/2021   PLT 374 05/28/2021   Lab Results  Component Value Date   NA 141 05/28/2021   K 4.5 05/28/2021   CO2 26 05/28/2021   GLUCOSE 88 05/28/2021   BUN 11 05/28/2021   CREATININE 0.84 05/28/2021   BILITOT 0.5 05/28/2021   AST 16 05/28/2021   ALT 17 05/28/2021   PROT 7.3 05/28/2021   CALCIUM 10.2 05/28/2021   ANIONGAP 9 04/10/2021   EGFR 101 05/28/2021   Lab Results  Component Value Date   CHOL 156 09/14/2019   Lab Results  Component Value Date   HDL 37 (L) 09/14/2019   Lab Results  Component Value Date   LDLCALC 100 (H) 09/14/2019   Lab Results   Component Value Date   TRIG 96 09/14/2019   Lab Results  Component Value Date   CHOLHDL 4.2 09/14/2019   Lab Results  Component Value Date   HGBA1C 5.6 05/28/2021      Assessment & Plan:   Problem List Items Addressed This Visit   None   No orders of the defined types were placed in this encounter.   Follow-up: No follow-ups on file.    Teodora Medici, DO

## 2021-06-20 NOTE — Progress Notes (Deleted)
Cardiology Office Note:   Date:  06/20/2021  NAME:  Patricia Dickerson    MRN: [id number] DOB:  2001-01-18   PCP:  Alba Cory, MD  Cardiologist:  None  Electrophysiologist:  None   Referring MD: Alba Cory, MD   No chief complaint on file. ***  History of Present Illness:   Patricia Dickerson is a 21 y.o. female with a hx of obesity (BMI 62) who is being seen today for the evaluation of chest pain at the request of Alba Cory, MD. Seen in ER 03/2021 for CP.   Past Medical History: Past Medical History:  Diagnosis Date   Vitamin D deficiency     Past Surgical History: Past Surgical History:  Procedure Laterality Date   MOUTH SURGERY      Current Medications: No outpatient medications have been marked as taking for the 06/21/21 encounter (Appointment) with O'Neal, Ronnald Ramp, MD.     Allergies:    Amoxicillin   Social History: Social History   Socioeconomic History   Marital status: Single    Spouse name: Not on file   Number of children: 0   Years of education: Not on file   Highest education level: Not on file  Occupational History   Not on file  Tobacco Use   Smoking status: Never   Smokeless tobacco: Never  Vaping Use   Vaping Use: Never used  Substance and Sexual Activity   Alcohol use: Not Currently   Drug use: Never   Sexual activity: Not Currently    Partners: Male    Birth control/protection: None  Other Topics Concern   Not on file  Social History Narrative   Not on file   Social Determinants of Health   Financial Resource Strain: Not on file  Food Insecurity: Not on file  Transportation Needs: Not on file  Physical Activity: Not on file  Stress: Not on file  Social Connections: Not on file     Family History: The patient's ***family history includes Breast cancer in her paternal aunt and paternal grandmother; Diabetes in her father; Thyroid disease in her mother.  ROS:   All other ROS reviewed and negative. Pertinent  positives noted in the HPI.     EKGs/Labs/Other Studies Reviewed:   The following studies were personally reviewed by me today:  EKG:  EKG is *** ordered today.  The ekg ordered today demonstrates ***, and was personally reviewed by me.   Recent Labs: 05/28/2021: ALT 17; BUN 11; Creat 0.84; Hemoglobin 14.3; Platelets 374; Potassium 4.5; Sodium 141   Recent Lipid Panel    Component Value Date/Time   CHOL 156 09/14/2019 1002   TRIG 96 09/14/2019 1002   HDL 37 (L) 09/14/2019 1002   CHOLHDL 4.2 09/14/2019 1002   LDLCALC 100 (H) 09/14/2019 1002    Physical Exam:   VS:  There were no vitals taken for this visit.   Wt Readings from Last 3 Encounters:  06/17/21 (!) 490 lb (222.3 kg)  05/28/21 (!) 495 lb (224.5 kg)  04/10/21 (!) 488 lb 1.6 oz (221.4 kg)    General: Well nourished, well developed, in no acute distress Head: Atraumatic, normal size  Eyes: PEERLA, EOMI  Neck: Supple, no JVD Endocrine: No thryomegaly Cardiac: Normal S1, S2; RRR; no murmurs, rubs, or gallops Lungs: Clear to auscultation bilaterally, no wheezing, rhonchi or rales  Abd: Soft, nontender, no hepatomegaly  Ext: No edema, pulses 2+ Musculoskeletal: No deformities, BUE and BLE strength normal and equal  Skin: Warm and dry, no rashes   Neuro: Alert and oriented to person, place, time, and situation, CNII-XII grossly intact, no focal deficits  Psych: Normal mood and affect   ASSESSMENT:   Patricia Dickerson is a 21 y.o. female who presents for the following: No diagnosis found.  PLAN:   There are no diagnoses linked to this encounter.  {Are you ordering a CV Procedure (e.g. stress test, cath, DCCV, TEE, etc)?   Press F2        :[id number]}  Disposition: No follow-ups on file.  Medication Adjustments/Labs and Tests Ordered: Current medicines are reviewed at length with the patient today.  Concerns regarding medicines are outlined above.  No orders of the defined types were placed in this encounter.  No  orders of the defined types were placed in this encounter.   There are no Patient Instructions on file for this visit.   Time Spent with Patient: I have spent a total of *** minutes with patient reviewing hospital notes, telemetry, EKGs, labs and examining the patient as well as establishing an assessment and plan that was discussed with the patient.  > 50% of time was spent in direct patient care.  Signed, Lenna Gilford. Flora Lipps, MD, [facility]   [facility]  [address] [place], [place] [address] [phone]  06/20/2021 1:14 PM

## 2021-06-21 ENCOUNTER — Ambulatory Visit: Payer: Medicaid Other | Admitting: Cardiovascular Disease

## 2021-06-21 DIAGNOSIS — R072 Precordial pain: Secondary | ICD-10-CM

## 2021-06-24 ENCOUNTER — Encounter: Payer: Medicaid Other | Admitting: Internal Medicine

## 2021-06-24 ENCOUNTER — Encounter: Payer: Self-pay | Admitting: Family Medicine

## 2021-06-24 NOTE — Progress Notes (Deleted)
Established Patient Office Visit  Subjective:  Patient ID: Patricia Dickerson, female    DOB: [birth year]  Age: 21 y.o. MRN: [id number]  CC: No chief complaint on file.   HPI Patricia Dickerson presents for follow up and ER follow up.   Discharge Date: 06/02/21 Hospital/facility: [facility] Diagnosis: acute non-intractable headache Procedures/tests: Pregnancy negative, UA with moderate blood and bacteria, flu /COVID/RSV negative, fecal heme occult negative. Given migraine cocktail which relieved symptoms Consultants: none New medications: None Discontinued medications: None Discharge instructions:  *** Status: {Blank multiple:19196::"better","worse","stable","fluctuating"}  Saw Gynecology 06/17/21 for irregular vaginal bleeding. Labs have all been unremarkable. Pap normal in 2021. Planning on vaginal Korea.   Past Medical History:  Diagnosis Date   Vitamin D deficiency     Past Surgical History:  Procedure Laterality Date   MOUTH SURGERY      Family History  Problem Relation Age of Onset   Thyroid disease Mother    Diabetes Father    Breast cancer Paternal Aunt        70s   Breast cancer Paternal Grandmother        not sure of age    Social History   Socioeconomic History   Marital status: Single    Spouse name: Not on file   Number of children: 0   Years of education: Not on file   Highest education level: Not on file  Occupational History   Not on file  Tobacco Use   Smoking status: Never   Smokeless tobacco: Never  Vaping Use   Vaping Use: Never used  Substance and Sexual Activity   Alcohol use: Not Currently   Drug use: Never   Sexual activity: Not Currently    Partners: Male    Birth control/protection: None  Other Topics Concern   Not on file  Social History Narrative   Not on file   Social Determinants of Health   Financial Resource Strain: Not on file  Food Insecurity: Not on file  Transportation Needs: Not on file  Physical Activity: Not on file   Stress: Not on file  Social Connections: Not on file  Intimate Partner Violence: Not on file    Outpatient Medications Prior to Visit  Medication Sig Dispense Refill   busPIRone (BUSPAR) 7.5 MG tablet Take 1 tablet once daily at night, may take 1 add'l tab during the day as needed for anxiety. (Patient not taking: Reported on 05/28/2021) 45 tablet 1   escitalopram (LEXAPRO) 10 MG tablet Take 1 tablet (10 mg total) by mouth daily. Take 1/2 tablet for first week (Patient taking differently: Take 10 mg by mouth daily.) 90 tablet 1   hydrOXYzine (ATARAX/VISTARIL) 25 MG tablet Take 1 tablet (25 mg total) by mouth every 6 (six) hours. (Patient not taking: Reported on 05/28/2021) 12 tablet 0   ibuprofen (ADVIL) 800 MG tablet Take 1 tablet (800 mg total) by mouth every 8 (eight) hours as needed. 30 tablet 0   omeprazole (PRILOSEC) 40 MG capsule Take 1 capsule (40 mg total) by mouth daily. 90 capsule 1   No facility-administered medications prior to visit.    Allergies  Allergen Reactions   Amoxicillin Hives and Rash    ROS Review of Systems    Objective:    Physical Exam  There were no vitals taken for this visit. Wt Readings from Last 3 Encounters:  06/17/21 (!) 490 lb (222.3 kg)  05/28/21 (!) 495 lb (224.5 kg)  04/10/21 (!) 488 lb 1.6 oz (  221.4 kg)     Health Maintenance Due  Topic Date Due   COVID-19 Vaccine (1) Never done   Hepatitis C Screening  Never done   HPV VACCINES (3 - 3-dose series) 01/29/2018       Topic Date Due   HPV VACCINES (3 - 3-dose series) 01/29/2018    No results found for: TSH Lab Results  Component Value Date   WBC 9.0 05/28/2021   HGB 14.3 05/28/2021   HCT 42.2 05/28/2021   MCV 89.0 05/28/2021   PLT 374 05/28/2021   Lab Results  Component Value Date   NA 141 05/28/2021   K 4.5 05/28/2021   CO2 26 05/28/2021   GLUCOSE 88 05/28/2021   BUN 11 05/28/2021   CREATININE 0.84 05/28/2021   BILITOT 0.5 05/28/2021   AST 16 05/28/2021   ALT 17  05/28/2021   PROT 7.3 05/28/2021   CALCIUM 10.2 05/28/2021   ANIONGAP 9 04/10/2021   EGFR 101 05/28/2021   Lab Results  Component Value Date   CHOL 156 09/14/2019   Lab Results  Component Value Date   HDL 37 (L) 09/14/2019   Lab Results  Component Value Date   LDLCALC 100 (H) 09/14/2019   Lab Results  Component Value Date   TRIG 96 09/14/2019   Lab Results  Component Value Date   CHOLHDL 4.2 09/14/2019   Lab Results  Component Value Date   HGBA1C 5.6 05/28/2021      Assessment & Plan:   Problem List Items Addressed This Visit   None   No orders of the defined types were placed in this encounter.   Follow-up: No follow-ups on file.    Patricia Medici, DO

## 2021-06-25 NOTE — Progress Notes (Signed)
This encounter was created in error - please disregard.

## 2021-06-26 ENCOUNTER — Ambulatory Visit: Payer: Medicaid Other

## 2021-06-27 ENCOUNTER — Ambulatory Visit: Payer: Medicaid Other | Admitting: Advanced Practice Midwife

## 2021-07-02 ENCOUNTER — Other Ambulatory Visit: Payer: Self-pay

## 2021-07-02 ENCOUNTER — Ambulatory Visit
Admission: RE | Admit: 2021-07-02 | Discharge: 2021-07-02 | Disposition: A | Discharge status: Home / Self Care | Payer: Medicaid Other | Source: Ambulatory Visit | Attending: Advanced Practice Midwife | Admitting: Advanced Practice Midwife

## 2021-07-02 DIAGNOSIS — R9389 Abnormal findings on diagnostic imaging of other specified body structures: Secondary | ICD-10-CM | POA: Diagnosis not present

## 2021-07-02 DIAGNOSIS — N926 Irregular menstruation, unspecified: Secondary | ICD-10-CM | POA: Insufficient documentation

## 2021-07-02 DIAGNOSIS — N83291 Other ovarian cyst, right side: Secondary | ICD-10-CM | POA: Diagnosis not present

## 2021-07-03 ENCOUNTER — Encounter: Payer: Self-pay | Admitting: Advanced Practice Midwife

## 2021-07-03 ENCOUNTER — Telehealth: Payer: Self-pay

## 2021-07-03 NOTE — Telephone Encounter (Signed)
No emergent findings.  Allow [name] to review, and as she has seen patient and knows her symptoms, she can then address ultrasound and any next steps.

## 2021-07-03 NOTE — Telephone Encounter (Signed)
Pt called triage reporting heavy bleeding passing large clots, no pain . Shes aware to go to the ER if bleeding is to the point where she is changing pad every hour. She states its not that bad, Has a appointment scheduled for 2/16, wants to come in earlier if possible

## 2021-07-08 ENCOUNTER — Other Ambulatory Visit: Payer: Self-pay

## 2021-07-08 ENCOUNTER — Encounter: Payer: Self-pay | Admitting: Licensed Practical Nurse

## 2021-07-08 ENCOUNTER — Ambulatory Visit: Payer: Medicaid Other | Admitting: Licensed Practical Nurse

## 2021-07-08 VITALS — BP 112/80 | Ht 74.0 in | Wt >= 6400 oz

## 2021-07-08 DIAGNOSIS — N939 Abnormal uterine and vaginal bleeding, unspecified: Secondary | ICD-10-CM | POA: Insufficient documentation

## 2021-07-08 MED ORDER — MEDROXYPROGESTERONE ACETATE 10 MG PO TABS: 20.0000 mg | ORAL_TABLET | Freq: Three times a day (TID) | ORAL | 0 refills | Status: DC

## 2021-07-08 NOTE — Progress Notes (Signed)
HPI:      [name] is a 21 y.o. G0P0000 who LMP was No LMP recorded. (Menstrual status: Irregular Periods)., presents today for a problem visit.  She was seen on 1/23 for Abnormal Uterine bleeding since November, was offered hormone therapy but declined. Since then, her bleeding had picked up-last week for 4 days- she had heavy bleeding (wore over night pads and changes then 2-times a day) and passed small and large clots (smaller than a quarter to gulf ball sized). Today her bleeding is like a heavy period.   She had an Korea that shows an endometrial lining measuring 24.3mm.   Patricia Dickerson is here today because she has been feeling dizzy and wants this bleeding to stop, so is open to hormonal treatment.   Usually Cycles are 28 to 30 days, last 5 days, the first 2 days she has bad cramps, bleeding is heaviest days 2-3 (changing maxi pad 2-3 times per day).  Previous evaluation: none. Prior Diagnosis: obesity. Previous Treatment: NSAID Ibuprofen with good improvement.  She is not sexually active.  Hx of STDs: none. She is premenopausal.   PMHx: She  has a past medical history of Vitamin D deficiency. Also,  has a past surgical history that includes Mouth surgery., family history includes Breast cancer in her paternal aunt and paternal grandmother; Diabetes in her father; Thyroid disease in her mother.,  reports that she has never smoked. She has never used smokeless tobacco. She reports that she does not currently use alcohol. She reports that she does not use drugs.  She  Current Outpatient Medications:    ibuprofen (ADVIL) 800 MG tablet, Take 1 tablet (800 mg total) by mouth every 8 (eight) hours as needed., Disp: 30 tablet, Rfl: 0   medroxyPROGESTERone (PROVERA) 10 MG tablet, Take 2 tablets (20 mg total) by mouth in the morning, at noon, and at bedtime for 7 days., Disp: 42 tablet, Rfl: 0   omeprazole (PRILOSEC) 40 MG capsule, Take 1 capsule (40 mg total) by mouth daily., Disp: 90  capsule, Rfl: 1   busPIRone (BUSPAR) 7.5 MG tablet, Take 1 tablet once daily at night, may take 1 add'l tab during the day as needed for anxiety. (Patient not taking: Reported on 05/28/2021), Disp: 45 tablet, Rfl: 1   escitalopram (LEXAPRO) 10 MG tablet, Take 1 tablet (10 mg total) by mouth daily. Take 1/2 tablet for first week (Patient not taking: Reported on 07/08/2021), Disp: 90 tablet, Rfl: 1   hydrOXYzine (ATARAX/VISTARIL) 25 MG tablet, Take 1 tablet (25 mg total) by mouth every 6 (six) hours. (Patient not taking: Reported on 05/28/2021), Disp: 12 tablet, Rfl: 0  Also, is allergic to amoxicillin.  Review of Systems  Constitutional: Negative.   Respiratory: Negative.    Cardiovascular: Negative.   Gastrointestinal: Negative.   Genitourinary: Negative.   Neurological:  Positive for dizziness.   Objective: BP 112/80    Ht 6\' 2"  (1.88 m)    Wt (!) 491 lb (222.7 kg)    BMI 63.04 kg/m  Physical Exam Constitutional:      General: She is not in acute distress.    Appearance: Normal appearance. She is obese.  HENT:     Mouth/Throat:     Mouth: Mucous membranes are moist.  Pulmonary:     Effort: Pulmonary effort is normal.  Neurological:     Mental Status: She is alert and oriented to person, place, and time.  Psychiatric:        Mood  and Affect: Mood normal.    ASSESSMENT/PLAN:   abnormal uterine bleeding, hormones prescribed   Problem List Items Addressed This Visit   None Visit Diagnoses     Abnormal uterine bleeding    -  Primary   Relevant Orders   CBC w/Diff/Platelet      -Reviewed pt's history and Korea with Dr Tiburcio Pea. Pt does not not desire contraception, as she is not sexually active.  Does desires to use hormones to stop the bleeding.  Denies smoking and CHTN.  Is obese.  Will do Provera 20mg  three times a day x 7 days and then RTC to clinic to decide next treatment.  May consider being placed on a OCP based on bleeding pattern and needs. Endometrial biopsy not needed at this  time.   -CBC collected today, will start taking a daily Iron supplement. Most likely anemic d/t blood loss.   did ask if she could have PCOS as she knows a cyst was seen on Patricia Dickerson, denies Hirsutum. Only 1 cyst seen, most likely not PCOS.   -Will review need for weight loss at future appointments (the correlation with obesity and hormones discussed with Korea, but not reviewed today).   [name], CNM  [name], [facility]  07/08/21  5:49 PM

## 2021-07-09 ENCOUNTER — Encounter: Payer: Self-pay | Admitting: Licensed Practical Nurse

## 2021-07-09 LAB — CBC WITH DIFFERENTIAL/PLATELET
Basophils Absolute: 0 10*3/uL (ref 0.0–0.2)
Basos: 0 %
EOS (ABSOLUTE): 0.1 10*3/uL (ref 0.0–0.4)
Eos: 1 %
Hematocrit: 40.7 % (ref 34.0–46.6)
Hemoglobin: 13.5 g/dL (ref 11.1–15.9)
Immature Grans (Abs): 0 10*3/uL (ref 0.0–0.1)
Immature Granulocytes: 0 %
Lymphocytes Absolute: 2.1 10*3/uL (ref 0.7–3.1)
Lymphs: 21 %
MCH: 29.4 pg (ref 26.6–33.0)
MCHC: 33.2 g/dL (ref 31.5–35.7)
MCV: 89 fL (ref 79–97)
Monocytes Absolute: 0.7 10*3/uL (ref 0.1–0.9)
Monocytes: 7 %
Neutrophils Absolute: 7.1 10*3/uL — ABNORMAL HIGH (ref 1.4–7.0)
Neutrophils: 71 %
Platelets: 354 10*3/uL (ref 150–450)
RBC: 4.59 x10E6/uL (ref 3.77–5.28)
RDW: 12.2 % (ref 11.7–15.4)
WBC: 10 10*3/uL (ref 3.4–10.8)

## 2021-07-10 DIAGNOSIS — J069 Acute upper respiratory infection, unspecified: Secondary | ICD-10-CM | POA: Diagnosis not present

## 2021-07-11 ENCOUNTER — Ambulatory Visit: Payer: Medicaid Other | Admitting: Advanced Practice Midwife

## 2021-07-15 ENCOUNTER — Ambulatory Visit: Payer: Medicaid Other | Admitting: Licensed Practical Nurse

## 2021-07-15 DIAGNOSIS — Z20822 Contact with and (suspected) exposure to covid-19: Secondary | ICD-10-CM | POA: Diagnosis not present

## 2021-07-15 DIAGNOSIS — K219 Gastro-esophageal reflux disease without esophagitis: Secondary | ICD-10-CM | POA: Diagnosis not present

## 2021-07-15 DIAGNOSIS — J069 Acute upper respiratory infection, unspecified: Secondary | ICD-10-CM | POA: Diagnosis not present

## 2021-07-15 DIAGNOSIS — R0789 Other chest pain: Secondary | ICD-10-CM | POA: Diagnosis not present

## 2021-07-15 DIAGNOSIS — R079 Chest pain, unspecified: Secondary | ICD-10-CM | POA: Diagnosis not present

## 2021-07-15 DIAGNOSIS — Z880 Allergy status to penicillin: Secondary | ICD-10-CM | POA: Diagnosis not present

## 2021-07-17 DIAGNOSIS — R079 Chest pain, unspecified: Secondary | ICD-10-CM | POA: Diagnosis not present

## 2021-07-30 ENCOUNTER — Ambulatory Visit: Payer: Medicaid Other | Admitting: Licensed Practical Nurse

## 2021-08-01 ENCOUNTER — Other Ambulatory Visit: Payer: Self-pay | Admitting: Licensed Practical Nurse

## 2021-08-01 ENCOUNTER — Ambulatory Visit: Payer: Medicaid Other | Admitting: Licensed Practical Nurse

## 2021-08-03 ENCOUNTER — Other Ambulatory Visit: Payer: Self-pay | Admitting: Licensed Practical Nurse

## 2021-08-04 NOTE — Progress Notes (Deleted)
?Cardiology Office Note:   ?Date:  08/04/2021  ?NAME:  Patricia Dickerson    ?MRN: [id number] ?DOB:  2000/07/14  ? ?PCP:  Alba Cory, MD  ?Cardiologist:  None  ?Electrophysiologist:  None  ? ?Referring MD: Alba Cory, MD  ? ?No chief complaint on file. ?*** ? ?History of Present Illness:   ?Patricia Dickerson is a 21 y.o. female with a hx of obesity who is being seen today for the evaluation of chest pain at the request of Alba Cory, MD. Seen in ER 03/2021 for non-cardiac CP.  ? ?Problem List ?Morbid Obesity  ?-BMI 63 ? ? ?Past Medical History: ?Past Medical History:  ?Diagnosis Date  ? Vitamin D deficiency   ? ? ?Past Surgical History: ?Past Surgical History:  ?Procedure Laterality Date  ? MOUTH SURGERY    ? ? ?Current Medications: ?No outpatient medications have been marked as taking for the 08/05/21 encounter (Appointment) with O'Neal, Ronnald Ramp, MD.  ?  ? ?Allergies:    ?Amoxicillin  ? ?Social History: ?Social History  ? ?Socioeconomic History  ? Marital status: Single  ?  Spouse name: Not on file  ? Number of children: 0  ? Years of education: Not on file  ? Highest education level: Not on file  ?Occupational History  ? Not on file  ?Tobacco Use  ? Smoking status: Never  ? Smokeless tobacco: Never  ?Vaping Use  ? Vaping Use: Never used  ?Substance and Sexual Activity  ? Alcohol use: Not Currently  ? Drug use: Never  ? Sexual activity: Not Currently  ?  Partners: Male  ?  Birth control/protection: None  ?Other Topics Concern  ? Not on file  ?Social History Narrative  ? Not on file  ? ?Social Determinants of Health  ? ?Financial Resource Strain: Not on file  ?Food Insecurity: Not on file  ?Transportation Needs: Not on file  ?Physical Activity: Not on file  ?Stress: Not on file  ?Social Connections: Not on file  ?  ? ?Family History: ?The patient's ***family history includes Breast cancer in her paternal aunt and paternal grandmother; Diabetes in her father; Thyroid disease in her mother. ? ?ROS:   ?All  other ROS reviewed and negative. Pertinent positives noted in the HPI.    ? ?EKGs/Labs/Other Studies Reviewed:   ?The following studies were personally reviewed by me today: ? ?EKG:  EKG is *** ordered today.  The ekg ordered today demonstrates ***, and was personally reviewed by me.  ? ?Recent Labs: ?05/28/2021: ALT 17; BUN 11; Creat 0.84; Potassium 4.5; Sodium 141 ?07/08/2021: Hemoglobin 13.5; Platelets 354  ? ?Recent Lipid Panel ?   ?Component Value Date/Time  ? CHOL 156 09/14/2019 1002  ? TRIG 96 09/14/2019 1002  ? HDL 37 (L) 09/14/2019 1002  ? CHOLHDL 4.2 09/14/2019 1002  ? LDLCALC 100 (H) 09/14/2019 1002  ? ? ?Physical Exam:   ?VS:  There were no vitals taken for this visit.   ?Wt Readings from Last 3 Encounters:  ?07/08/21 (!) 491 lb (222.7 kg)  ?06/17/21 (!) 490 lb (222.3 kg)  ?05/28/21 (!) 495 lb (224.5 kg)  ?  ?General: Well nourished, well developed, in no acute distress ?Head: Atraumatic, normal size  ?Eyes: PEERLA, EOMI  ?Neck: Supple, no JVD ?Endocrine: No thryomegaly ?Cardiac: Normal S1, S2; RRR; no murmurs, rubs, or gallops ?Lungs: Clear to auscultation bilaterally, no wheezing, rhonchi or rales  ?Abd: Soft, nontender, no hepatomegaly  ?Ext: No edema, pulses 2+ ?Musculoskeletal: No deformities,  BUE and BLE strength normal and equal ?Skin: Warm and dry, no rashes   ?Neuro: Alert and oriented to person, place, time, and situation, CNII-XII grossly intact, no focal deficits  ?Psych: Normal mood and affect  ? ?ASSESSMENT:   ?Patricia Dickerson is a 21 y.o. female who presents for the following: ?No diagnosis found. ? ?PLAN:   ?There are no diagnoses linked to this encounter. ? ?{Are you ordering a CV Procedure (e.g. stress test, cath, DCCV, TEE, etc)?   Press F2        :[id number]} ? ?Disposition: No follow-ups on file. ? ?Medication Adjustments/Labs and Tests Ordered: ?Current medicines are reviewed at length with the patient today.  Concerns regarding medicines are outlined above.  ?No orders of the defined  types were placed in this encounter. ? ?No orders of the defined types were placed in this encounter. ? ? ?There are no Patient Instructions on file for this visit.  ? ?Time Spent with Patient: I have spent a total of *** minutes with patient reviewing hospital notes, telemetry, EKGs, labs and examining the patient as well as establishing an assessment and plan that was discussed with the patient.  > 50% of time was spent in direct patient care. ? ?Signed, ?Gerri Spore T. Flora Lipps, MD, [facility] ?[facility]  [facility]  ?[address], [address] ?[place], [place] [address] ?[phone]  ?08/04/2021 5:32 PM    ? ?

## 2021-08-05 ENCOUNTER — Ambulatory Visit: Payer: Medicaid Other | Admitting: Cardiovascular Disease

## 2021-08-05 DIAGNOSIS — R072 Precordial pain: Secondary | ICD-10-CM

## 2021-08-06 ENCOUNTER — Ambulatory Visit: Payer: Medicaid Other | Admitting: Internal Medicine

## 2021-08-06 NOTE — Progress Notes (Deleted)
?Cardiology Office Note:   ? ?Date:  08/06/2021  ? ?ID:  [name], DOB 2000-11-02, MRN [id number] ? ?PCP:  Alba Cory, MD  ? ?[facility] Providers ?Cardiologist:  Alverda Skeans, MD ?Referring MD: Alba Cory, MD  ? ?Chief Complaint/Reason for Referral:  ER follow up chest pain and palpitations ? ?ASSESSMENT:   ? ?Precordial pain ? ?Dyspnea, unspecified type ? ?Palpitations ? ?BMI 60.0-69.9, adult (HCC) ? ?PLAN:   ? ?In order of problems listed above: ? ? We will obtain a coronary CTA and echocardiogram to evaluate further.  If the patient has mild obstructive coronary artery disease, they will require a statin (with goal LDL < 70) and aspirin, if they have high-grade disease we will need to consider optimal medical therapy and if symptoms are refractory to medical therapy, then a cardiac catheterization with possible PCI will be pursued to alleviate symptoms.  If they have high risk disease we will proceed directly to cardiac catheterization.  Follow up PRN*** in *** months*** years***. ?We will obtain echocardiogram and sleep apnea evaluation.  Her shortness of breath may be due to her body habitus.   ?We will obtain echocardiogram and monitor.   ?We will refer to pharmacy for recommendations regarding pharmacotherapy. ? ?     ? ?{Are you ordering a CV Procedure (e.g. stress test, cath, DCCV, TEE, etc)?   Press F2        :[id number]}  ? ?Dispo:  No follow-ups on file.  ?  ? ?Medication Adjustments/Labs and Tests Ordered: ?Current medicines are reviewed at length with the patient today.  Concerns regarding medicines are outlined above.  ? ?Tests Ordered: ?No orders of the defined types were placed in this encounter. ? ? ?Medication Changes: ?No orders of the defined types were placed in this encounter. ? ? ?History of Present Illness:   ? ?FOCUSED PROBLEM LIST:   ?1.  BMI of 60 ?2.  GERD ? ? ?The patient is a 21 y.o. female with the indicated medical history here recommendations regarding  palpitations and shortness of breath.  The patient was seen in the emergency department in November of last year due to shortness of breath and intermittent palpitations.  She described gasping for air while she was sleeping.  She had been referred for obstructive sleep apnea evaluation.  Her blood pressure was 149/91.  EKG, chest x-ray, cardiac biomarkers, and laboratories were all reassuring.  She was discharged.  She presented again in February at an outside hospital with complaints of chest pressure.  Her EKG was unrevealing.  She had a low-grade temperature at that time and was getting over a URI. ?    ?  ?Previous Medical History: ?Past Medical History:  ?Diagnosis Date  ? Vitamin D deficiency   ? ? ? ?Current Medications: ?No outpatient medications have been marked as taking for the 08/06/21 encounter (Appointment) with Orbie Pyo, MD.  ?  ? ?Allergies:    ?Amoxicillin  ? ?Social History:   ?Social History  ? ?Tobacco Use  ? Smoking status: Never  ? Smokeless tobacco: Never  ?Vaping Use  ? Vaping Use: Never used  ?Substance Use Topics  ? Alcohol use: Not Currently  ? Drug use: Never  ?  ? ?Family Hx: ?Family History  ?Problem Relation Age of Onset  ? Thyroid disease Mother   ? Diabetes Father   ? Breast cancer Paternal Aunt   ?     47s  ? Breast cancer Paternal Grandmother   ?  not sure of age  ?  ? ?Review of Systems:   ?Please see the history of present illness.    ?All other systems reviewed and are negative. ?  ? ? ?EKGs/Labs/Other Test Reviewed:   ? ?EKG: November 2022 sinus rhythm ? ?Prior CV studies: ?None available ? ?Imaging studies that I have independently reviewed today: None relevant ? ?Recent Labs: ?05/28/2021: ALT 17; BUN 11; Creat 0.84; Potassium 4.5; Sodium 141 ?07/08/2021: Hemoglobin 13.5; Platelets 354  ? ?Recent Lipid Panel ?Lab Results  ?Component Value Date/Time  ? CHOL 156 09/14/2019 10:02 AM  ? TRIG 96 09/14/2019 10:02 AM  ? HDL 37 (L) 09/14/2019 10:02 AM  ? LDLCALC 100 (H)  09/14/2019 10:02 AM  ? ? ?Risk Assessment/Calculations:   ? ?{Does this patient have ATRIAL FIBRILLATION?[phone]} ?    ? ?Physical Exam:   ? ?VS:  There were no vitals taken for this visit.   ?Wt Readings from Last 3 Encounters:  ?07/08/21 (!) 491 lb (222.7 kg)  ?06/17/21 (!) 490 lb (222.3 kg)  ?05/28/21 (!) 495 lb (224.5 kg)  ?  ?GENERAL:  No apparent distress, AOx3 ?HEENT:  No carotid bruits, +2 carotid impulses, no scleral icterus ?CAR: RRR Irregular RR*** no murmurs***, gallops, rubs, or thrills ?RES:  Clear to auscultation bilaterally ?ABD:  Soft, nontender, nondistended, positive bowel sounds x 4 ?VASC:  +2 radial pulses, +2 carotid pulses, palpable pedal pulses ?NEURO:  CN 2-12 grossly intact; motor and sensory grossly intact ?PSYCH:  No active depression or anxiety ?EXT:  No edema, ecchymosis, or cyanosis ? ?Signed, ?Orbie Pyo, MD  ?08/06/2021 7:15 AM    ?[facility] ?[address] [place], [place], [place]  [address] ?Phone: [phone]; Fax: [phone]  ? ?Note:  This document was prepared using Dragon voice recognition software and may include unintentional dictation errors. ?

## 2021-08-09 ENCOUNTER — Other Ambulatory Visit: Payer: Self-pay

## 2021-08-09 ENCOUNTER — Ambulatory Visit: Payer: Medicaid Other | Admitting: Licensed Practical Nurse

## 2021-08-09 VITALS — BP 128/70 | Ht 74.0 in | Wt >= 6400 oz

## 2021-08-09 DIAGNOSIS — N939 Abnormal uterine and vaginal bleeding, unspecified: Secondary | ICD-10-CM | POA: Diagnosis not present

## 2021-08-12 MED ORDER — NORETHIN-ETH ESTRADIOL-FE 0.4-35 MG-MCG PO CHEW: 1.0000 | CHEWABLE_TABLET | Freq: Every day | ORAL | 0 refills | Status: DC

## 2021-08-12 NOTE — Progress Notes (Signed)
?HPI: ?Patricia Dickerson was seen on 2/13 for AUB.  She was prescribed Provera 20mg TID x 7 days. Due to work conflicts she was not able to return sooner than today. Patricia Dickerson reports that her bleeding stopped within a few days of taking the Provera. But then she started to bleed again once she was finished the Provera.  She was not able to come into the clinic, her mother was able to convince the pharmacy to give her more Provera. She had cramping last week. She now has light brown discharge. Patricia Dickerson desires management, she wants this bleeding to  stop.  She is currently not sexually active.  Is open to hormones.  ? ?Pelvic US on 2/7 ?IMPRESSION: ?1. Endometrial stripe thickened up to 24.5 mm. If bleeding remains ?unresponsive to hormonal or medical therapy, focal lesion work-up ?with sonohysterogram should be considered. Endometrial biopsy should ?also be considered in pre-menopausal patients at high risk for ?endometrial carcinoma. (Ref: Radiological Reasoning: Algorithmic ?Workup of Abnormal Vaginal Bleeding with Endovaginal Sonography and ?Sonohysterography. AJR [id number]). ?2. Otherwise normal sonographic appearance of the uterus. ?3. 3.9 cm simple right ovarian cyst, likely benign given size and ?appearance. No follow up imaging recommended. Note: This ?recommendation does not apply to premenarchal patients or to those ?with increased risk (genetic, family history, elevated tumor markers ?or other high-risk factors) of ovarian cancer. Reference: Radiology ?2019 Nov; 293(2):359-371. ? ?    PMHx: ?She  has a past medical history of Vitamin D deficiency. Also,  has a past surgical history that includes Mouth surgery., family history includes Breast cancer in her paternal aunt and paternal grandmother; Diabetes in her father; Thyroid disease in her mother.,  reports that she has never smoked. She has never used smokeless tobacco. She reports that she does not currently use alcohol. She reports that she does not use  drugs. ? ?She  ?Current Outpatient Medications:  ?  ibuprofen (ADVIL) 800 MG tablet, Take 1 tablet (800 mg total) by mouth every 8 (eight) hours as needed., Disp: 30 tablet, Rfl: 0 ?  omeprazole (PRILOSEC) 40 MG capsule, Take 1 capsule (40 mg total) by mouth daily., Disp: 90 capsule, Rfl: 1 ?  busPIRone (BUSPAR) 7.5 MG tablet, Take 1 tablet once daily at night, may take 1 add'l tab during the day as needed for anxiety. (Patient not taking: Reported on 05/28/2021), Disp: 45 tablet, Rfl: 1 ?  escitalopram (LEXAPRO) 10 MG tablet, Take 1 tablet (10 mg total) by mouth daily. Take 1/2 tablet for first week (Patient not taking: Reported on 07/08/2021), Disp: 90 tablet, Rfl: 1 ?  hydrOXYzine (ATARAX/VISTARIL) 25 MG tablet, Take 1 tablet (25 mg total) by mouth every 6 (six) hours. (Patient not taking: Reported on 05/28/2021), Disp: 12 tablet, Rfl: 0 ?  medroxyPROGESTERone (PROVERA) 10 MG tablet, Take 2 tablets (20 mg total) by mouth in the morning, at noon, and at bedtime for 7 days., Disp: 42 tablet, Rfl: 0  ?Also, is allergic to amoxicillin. ? ?ROS see above  ? ?Objective: ?BP 128/70   Ht 6\' 2"  (1.88 m)   Wt (!) 489 lb (221.8 kg)   BMI 62.78 kg/m?  ?Physical Exam ?Constitutional:   ?   Appearance: Normal appearance.  ?Pulmonary:  ?   Effort: Pulmonary effort is normal.  ?Neurological:  ?   General: No focal deficit present.  ?   Mental Status: She is alert and oriented to person, place, and time.  ?Psychiatric:     ?   Mood and Affect: Mood normal.  ? ? ?  ASSESSMENT/PLAN:  Abnormal uterine bleeding  ? ?Problem List Items Addressed This Visit   ?None ? ? ? ?Reviewed options: 1) surgical management with D and C hysteroscopy 2) hormonal management.  Endometrial biopsy not recommended at this time. ? ?Hormonal options discussed including, PillS, IUD, and Depo.  At this time Patricia Dickerson prefers to try pills but may consider IUD in the future.  1 sample pack of lo loestrin  given, pt instructed don use and side effects. Will fu in 1  month to determine further management. If she is happy with OCP will continue, or may return for IUD insertion.  If bleeding persists, consider surgical option. ?Roberto Scales, CNM  ?[facility], [facility]  ?08/12/21  ?12:29 PM  ? ?

## 2021-08-14 ENCOUNTER — Encounter: Payer: Self-pay | Admitting: Licensed Practical Nurse

## 2021-08-18 NOTE — Progress Notes (Signed)
?Cardiology Office Note:   ?Date:  08/19/2021  ?NAME:  Patricia Dickerson    ?MRN: [id number] ?DOB:  Jun 07, 2000  ? ?PCP:  Alba Cory, MD  ?Cardiologist:  None  ?Electrophysiologist:  None  ? ?Referring MD: Gerhard Munch, MD  ? ?Chief Complaint  ?Patient presents with  ? Chest Pain  ? ? ?History of Present Illness:   ?Patricia Dickerson is a 21 y.o. female with a hx of obesity who is being seen today for the evaluation of chest pain at the request of Gerhard Munch, MD. Seen in ER 04/10/2021 for non-cardiac CP. She reports she was having sharp tightness in her chest.  She will like a rock was in her chest.  She was evaluated in the emergency room and everything was negative.  She describes having this sometimes.  It can occur periodically.  She has been placed on omeprazole and her symptoms have improved.  She had no further chest tightness episodes.  She reports she is been diagnosed with palpitations in the past.  They can occur 1-2 times per week.  They can last seconds.  She is had this for years.  No increased recurrence or episodes.  She does have concerns for sleep apnea per her primary care physician.  She will undergo a home sleep study.  She is morbidly obese with a BMI of 63.  She is working on losing weight.  She is working on diet and exercise as well.  Blood pressure slightly elevated today however this appears to be an outlier.  I have just recommended diet and exercise.  Her EKG demonstrates sinus rhythm with no acute ischemic changes.  No murmurs on exam.  She overall appears to be stable from a cardiovascular standpoint.  Her chest symptoms have resolved since her emergency room visit.  I have a low suspicion for underlying cardiac disease.  She does not smoke.  Denies alcohol or drug use.  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  She can walk 15 to 20 minutes without limitations.  No concerns for angina or underlying shortness of breath.  Cardiovascular examination largely  normal. ? ?Past Medical History: ?Past Medical History:  ?Diagnosis Date  ? Vitamin D deficiency   ? ? ?Past Surgical History: ?Past Surgical History:  ?Procedure Laterality Date  ? MOUTH SURGERY    ? ? ?Current Medications: ?Current Meds  ?Medication Sig  ? escitalopram (LEXAPRO) 10 MG tablet Take 1 tablet (10 mg total) by mouth daily. Take 1/2 tablet for first week  ? Norethin-Eth Estradiol-Fe [facility] FE) 0.4-35 MG-MCG tablet Chew 1 tablet by mouth daily.  ? omeprazole (PRILOSEC) 40 MG capsule Take 1 capsule (40 mg total) by mouth daily.  ?  ? ?Allergies:    ?Amoxicillin  ? ?Social History: ?Social History  ? ?Socioeconomic History  ? Marital status: Single  ?  Spouse name: Not on file  ? Number of children: 0  ? Years of education: Not on file  ? Highest education level: Not on file  ?Occupational History  ? Occupation: Works with Group Home [employer] Program  ?Tobacco Use  ? Smoking status: Never  ? Smokeless tobacco: Never  ?Vaping Use  ? Vaping Use: Never used  ?Substance and Sexual Activity  ? Alcohol use: Not Currently  ? Drug use: Never  ? Sexual activity: Not Currently  ?  Partners: Male  ?  Birth control/protection: None  ?Other Topics Concern  ? Not on file  ?Social  History Narrative  ? Not on file  ? ?Social Determinants of Health  ? ?Financial Resource Strain: Not on file  ?Food Insecurity: Not on file  ?Transportation Needs: Not on file  ?Physical Activity: Not on file  ?Stress: Not on file  ?Social Connections: Not on file  ?  ? ?Family History: ?The patient's family history includes Breast cancer in her paternal aunt and paternal grandmother; Diabetes in her father; Thyroid disease in her mother. ? ?ROS:   ?All other ROS reviewed and negative. Pertinent positives noted in the HPI.    ? ?EKGs/Labs/Other Studies Reviewed:   ?The following studies were personally reviewed by me today: ? ?EKG:  EKG is ordered today.  The ekg ordered today demonstrates normal sinus rhythm heart 75, no acute ischemic changes or  evidence of infarction, and was personally reviewed by me.  ? ?Recent Labs: ?05/28/2021: ALT 17; BUN 11; Creat 0.84; Potassium 4.5; Sodium 141 ?07/08/2021: Hemoglobin 13.5; Platelets 354  ? ?Recent Lipid Panel ?   ?Component Value Date/Time  ? CHOL 156 09/14/2019 1002  ? TRIG 96 09/14/2019 1002  ? HDL 37 (L) 09/14/2019 1002  ? CHOLHDL 4.2 09/14/2019 1002  ? LDLCALC 100 (H) 09/14/2019 1002  ? ? ?Physical Exam:   ?VS:  BP (!) 142/91   Pulse 75   Ht 6\' 2"  (1.88 m)   Wt (!) 491 lb 12.8 oz (223.1 kg)   BMI 63.14 kg/m?    ?Wt Readings from Last 3 Encounters:  ?08/19/21 (!) 491 lb 12.8 oz (223.1 kg)  ?08/09/21 (!) 489 lb (221.8 kg)  ?07/08/21 (!) 491 lb (222.7 kg)  ?  ?General: Well nourished, well developed, in no acute distress ?Head: Atraumatic, normal size  ?Eyes: PEERLA, EOMI  ?Neck: Supple, no JVD ?Endocrine: No thryomegaly ?Cardiac: Normal S1, S2; RRR; no murmurs, rubs, or gallops ?Lungs: Clear to auscultation bilaterally, no wheezing, rhonchi or rales  ?Abd: Soft, nontender, no hepatomegaly  ?Ext: No edema, pulses 2+ ?Musculoskeletal: No deformities, BUE and BLE strength normal and equal ?Skin: Warm and dry, no rashes   ?Neuro: Alert and oriented to person, place, time, and situation, CNII-XII grossly intact, no focal deficits  ?Psych: Normal mood and affect  ? ?ASSESSMENT:   ?Patricia Dickerson is a 21 y.o. female who presents for the following: ?1. Precordial pain   ?2. Gastroesophageal reflux disease without esophagitis   ?3. Obesity, morbid, BMI 50 or higher (HCC)   ?4. Elevated blood-pressure reading without diagnosis of hypertension   ?5. Palpitations   ? ? ?PLAN:   ?1. Precordial pain ?2. Gastroesophageal reflux disease without esophagitis ?-Evaluated in the emergency room in November for chest tightness.  Cardiac enzymes negative.  EKG normal.  No further symptoms since starting omeprazole.  Suspect this is all acid reflux related.  I recommended to continue her omeprazole for now.  She does not need cardiac  testing.  She is too young for obstructive CAD. ? ?3. Obesity, morbid, BMI 50 or higher (HCC) ?-Diet and exercise have been recommended. ? ?4. Elevated blood-pressure reading without diagnosis of hypertension ?-Blood pressure elevated today however no diagnosis of hypertension.  I recommended regular exercise and diet.  She should follow low-sodium precautions as well. ? ?5. Palpitations ?-She has had these for years.  Symptoms occur infrequently.  Do not really bother her.  For now we will forego testing.  She will see us back as needed. ? ?Disposition: Return if symptoms worsen or fail to improve. ? ?Medication Adjustments/Labs  and Tests Ordered: ?Current medicines are reviewed at length with the patient today.  Concerns regarding medicines are outlined above.  ?Orders Placed This Encounter  ?Procedures  ? EKG 12-Lead  ? ?No orders of the defined types were placed in this encounter. ? ? ?Patient Instructions  ?Medication Instructions:  ?The current medical regimen is effective;  continue present plan and medications. ? ?*If you need a refill on your cardiac medications before your next appointment, please call your pharmacy* ? ? ?Follow-Up: ?At [facility], you and your health needs are our priority.  As part of our continuing mission to provide you with exceptional heart care, we have created designated Provider Care Teams.  These Care Teams include your primary Cardiologist (physician) and Advanced Practice Providers (APPs -  Physician Assistants and Nurse Practitioners) who all work together to provide you with the care you need, when you need it. ? ?We recommend signing up for the patient portal called "MyChart".  Sign up information is provided on this After Visit Summary.  MyChart is used to connect with patients for Virtual Visits (Telemedicine).  Patients are able to view lab/test results, encounter notes, upcoming appointments, etc.  Non-urgent messages can be sent to your provider as well.   ?To learn  more about what you can do with MyChart, go to [email or web address].   ? ?Your next appointment:   ?As needed ? ?The format for your next appointment:   ?In Person ? ?Provider:   ?Lennie Odor, MD  ? ?

## 2021-08-19 ENCOUNTER — Ambulatory Visit: Payer: Medicaid Other | Admitting: Cardiovascular Disease

## 2021-08-19 ENCOUNTER — Encounter: Payer: Self-pay | Admitting: Cardiovascular Disease

## 2021-08-19 ENCOUNTER — Other Ambulatory Visit: Payer: Self-pay

## 2021-08-19 VITALS — BP 142/91 | HR 75 | Ht 74.0 in | Wt >= 6400 oz

## 2021-08-19 DIAGNOSIS — R002 Palpitations: Secondary | ICD-10-CM | POA: Diagnosis not present

## 2021-08-19 DIAGNOSIS — R030 Elevated blood-pressure reading, without diagnosis of hypertension: Secondary | ICD-10-CM

## 2021-08-19 DIAGNOSIS — R072 Precordial pain: Secondary | ICD-10-CM

## 2021-08-19 DIAGNOSIS — K219 Gastro-esophageal reflux disease without esophagitis: Secondary | ICD-10-CM

## 2021-08-19 NOTE — Patient Instructions (Signed)
Medication Instructions:  The current medical regimen is effective;  continue present plan and medications.  *If you need a refill on your cardiac medications before your next appointment, please call your pharmacy*    Follow-Up: At [facility], you and your health needs are our priority.  As part of our continuing mission to provide you with exceptional heart care, we have created designated Provider Care Teams.  These Care Teams include your primary Cardiologist (physician) and Advanced Practice Providers (APPs -  Physician Assistants and Nurse Practitioners) who all work together to provide you with the care you need, when you need it.  We recommend signing up for the patient portal called "MyChart".  Sign up information is provided on this After Visit Summary.  MyChart is used to connect with patients for Virtual Visits (Telemedicine).  Patients are able to view lab/test results, encounter notes, upcoming appointments, etc.  Non-urgent messages can be sent to your provider as well.   To learn more about what you can do with MyChart, go to [email or web address].    Your next appointment:   As needed  The format for your next appointment:   In Person  Provider:   [facility], MD      

## 2021-09-02 ENCOUNTER — Encounter: Payer: Self-pay | Admitting: Licensed Practical Nurse

## 2021-09-03 ENCOUNTER — Other Ambulatory Visit: Payer: Self-pay | Admitting: Licensed Practical Nurse

## 2021-09-03 MED ORDER — MEDROXYPROGESTERONE ACETATE 5 MG PO TABS
5.0000 mg | ORAL_TABLET | Freq: Every day | ORAL | 3 refills | Status: DC
Start: 2021-09-03 — End: 2022-01-06

## 2021-09-03 NOTE — Progress Notes (Signed)
TC to pt.  Patricia Dickerson is about to run out of the pill pack given to her. She did bleed last week for a few days, the bleeding was not too heavy.  Prefers provera over the OCP's.  Offered Depot Provera or Progesterone only IUD, as they are the preferred methods for AUB. [name] states she does not want anything with shots and does not want anything in her.  Her preference is pills. She had a visit with a cardiologist this week, her BP was 142/91 at that visit.  [name] denies any hx of CHTN.  ?Reviewed pt's desires for Provera with Dr Jerene Pitch, per Dr Jerene Pitch, ok to use Provera daily. ? ?TC at 1519: ?I have sent in a prescription for Provera 5mg  daily, you will start by taking 1 tablet a day, we may need to adjust this dose. This medication does not prevent pregnancy, please use condoms or or return to the office for birth control if you become sexually active.  ?Reviewed risks of blood clots while on Provera. ?RTC in 3 months for fu ? , CNM  ?[name], [facility]  ?09/03/21  ?3:20 PM  ? ?

## 2021-09-09 DIAGNOSIS — H5213 Myopia, bilateral: Secondary | ICD-10-CM | POA: Diagnosis not present

## 2021-10-21 DIAGNOSIS — Z880 Allergy status to penicillin: Secondary | ICD-10-CM | POA: Diagnosis not present

## 2021-10-21 DIAGNOSIS — S61210A Laceration without foreign body of right index finger without damage to nail, initial encounter: Secondary | ICD-10-CM | POA: Diagnosis not present

## 2021-10-22 ENCOUNTER — Telehealth: Payer: Self-pay

## 2021-10-22 DIAGNOSIS — S50361A Insect bite (nonvenomous) of right elbow, initial encounter: Secondary | ICD-10-CM | POA: Diagnosis not present

## 2021-10-22 DIAGNOSIS — Z880 Allergy status to penicillin: Secondary | ICD-10-CM | POA: Diagnosis not present

## 2021-10-22 DIAGNOSIS — L539 Erythematous condition, unspecified: Secondary | ICD-10-CM | POA: Diagnosis not present

## 2021-10-22 DIAGNOSIS — Z6841 Body Mass Index (BMI) 40.0 and over, adult: Secondary | ICD-10-CM | POA: Diagnosis not present

## 2021-10-22 NOTE — Telephone Encounter (Signed)
Transition Care Management Unsuccessful Follow-up Telephone Call  Date of discharge and from where:  10/21/2021-[facility]   Attempts:  1st Attempt  Reason for unsuccessful TCM follow-up call:  Left voice message

## 2021-10-23 NOTE — Telephone Encounter (Signed)
Transition Care Management Unsuccessful Follow-up Telephone Call  Date of discharge and from where:   10/21/2021-[facility]   Attempts:  2nd Attempt  Reason for unsuccessful TCM follow-up call:  Voice mail full

## 2021-10-24 ENCOUNTER — Telehealth: Payer: Self-pay

## 2021-10-24 NOTE — Telephone Encounter (Signed)
Transition Care Management Unsuccessful Follow-up Telephone Call  Date of discharge and from where:  10/23/2021-[facility]  Attempts:  1st Attempt  Reason for unsuccessful TCM follow-up call:  Voice mail full

## 2021-10-24 NOTE — Telephone Encounter (Signed)
Transition Care Management Unsuccessful Follow-up Telephone Call  Date of discharge and from where:  10/21/2021-[facility]  Attempts:  3rd Attempt  Reason for unsuccessful TCM follow-up call:  Voice mail full

## 2021-10-25 NOTE — Telephone Encounter (Signed)
Transition Care Management Unsuccessful Follow-up Telephone Call  Date of discharge and from where:  10/23/2021-[facility]  Attempts:  2nd Attempt  Reason for unsuccessful TCM follow-up call:  Voice mail full

## 2021-10-28 NOTE — Telephone Encounter (Signed)
Transition Care Management Unsuccessful Follow-up Telephone Call  Date of discharge and from where:  10/23/2021-[facility]  Attempts:  3rd Attempt  Reason for unsuccessful TCM follow-up call:  Unable to reach patient

## 2021-11-03 DIAGNOSIS — Z6841 Body Mass Index (BMI) 40.0 and over, adult: Secondary | ICD-10-CM | POA: Diagnosis not present

## 2021-11-03 DIAGNOSIS — Z0000 Encounter for general adult medical examination without abnormal findings: Secondary | ICD-10-CM | POA: Diagnosis not present

## 2021-11-03 DIAGNOSIS — H9201 Otalgia, right ear: Secondary | ICD-10-CM | POA: Diagnosis not present

## 2021-11-03 DIAGNOSIS — Z1331 Encounter for screening for depression: Secondary | ICD-10-CM | POA: Diagnosis not present

## 2021-11-03 DIAGNOSIS — Z13228 Encounter for screening for other metabolic disorders: Secondary | ICD-10-CM | POA: Diagnosis not present

## 2021-11-03 DIAGNOSIS — Z3202 Encounter for pregnancy test, result negative: Secondary | ICD-10-CM | POA: Diagnosis not present

## 2021-11-03 DIAGNOSIS — Z1389 Encounter for screening for other disorder: Secondary | ICD-10-CM | POA: Diagnosis not present

## 2021-11-03 DIAGNOSIS — K219 Gastro-esophageal reflux disease without esophagitis: Secondary | ICD-10-CM | POA: Diagnosis not present

## 2021-11-03 DIAGNOSIS — F3289 Other specified depressive episodes: Secondary | ICD-10-CM | POA: Diagnosis not present

## 2021-12-05 ENCOUNTER — Ambulatory Visit: Payer: Medicaid Other | Admitting: Licensed Practical Nurse

## 2021-12-12 ENCOUNTER — Telehealth: Payer: Self-pay

## 2021-12-12 NOTE — Patient Instructions (Signed)
Visit Information  Ms. Golda was given information about Medicaid Managed Care team care coordination services as a part of their Healthy [facility] Medicaid benefit. ELYN KROGH  will contact for further explanation* to engagement with the [facility] Managed Care team.   If you are experiencing a medical emergency, please call 911 or report to your local emergency department or urgent care.   If you have a non-emergency medical problem during routine business hours, please contact your provider's office and ask to speak with a nurse.   For questions related to your Healthy [facility] health plan, please call: [phone] or visit the homepage here: [email or web address]  If you would like to schedule transportation through your Healthy [facility] plan, please call the following number at least 2 days in advance of your appointment: [phone]  For information about your ride after you set it up, call Ride Assist at [phone]. Use this number to activate a Will Call pickup, or if your transportation is late for a scheduled pickup. Use this number, too, if you need to make a change or cancel a previously scheduled reservation.  If you need transportation services right away, call [phone]. The after-hours call center is staffed 24 hours to handle ride assistance and urgent reservation requests (including discharges) 365 days a year. Urgent trips include sick visits, hospital discharge requests and life-sustaining treatment.  Call the [facility] Line at [phone], at any time, 24 hours a day, 7 days a week. If you are in danger or need immediate medical attention call 911.  If you would like help to quit smoking, call 1-800-QUIT-NOW (508-148-1972) OR Espaol: 1-855-Djelo-Ya (1-740-814-4818) o para ms informacin haga clic aqu or Text READY to 563-149 to register via text  Ms. Giammarco - following are the goals we discussed in your  visit today:   Goals Addressed   None       The  Patient                                              has been provided with contact information for the Managed Medicaid care management team and has been advised to call with any health related questions or concerns.   Gus Puma, BSW, [facility]  [place]  High Risk Managed Medicaid Team  [phone]   Following is a copy of your plan of care:  There are no care plans that you recently modified to display for this patient.

## 2021-12-12 NOTE — Patient Outreach (Signed)
Care Coordination  12/12/2021  LAKELYNN SEVERTSON [birth year] [phone]  BSW completed a telephone outreach with patient to offer MM services. Patient stated it was something she was interested in but was at work and wanted to contact BSW back to talk more about it. BSW provided patient with her telephone number for a call back.  Gus Puma, BSW, [facility]  [place]  High Risk Managed Medicaid Team  [phone]

## 2021-12-16 ENCOUNTER — Telehealth: Payer: Self-pay | Admitting: Licensed Practical Nurse

## 2021-12-16 ENCOUNTER — Ambulatory Visit (INDEPENDENT_AMBULATORY_CARE_PROVIDER_SITE_OTHER): Payer: Medicaid Other | Admitting: Licensed Practical Nurse

## 2021-12-16 DIAGNOSIS — N939 Abnormal uterine and vaginal bleeding, unspecified: Secondary | ICD-10-CM | POA: Diagnosis not present

## 2021-12-16 MED ORDER — MEDROXYPROGESTERONE ACETATE 5 MG PO TABS: 5.0000 mg | ORAL_TABLET | Freq: Every day | ORAL | 0 refills | Status: DC

## 2021-12-16 NOTE — Progress Notes (Signed)
I connected with  [name] on 12/16/21 by Telephone  and verified that I am speaking with the correct person using two identifiers. Patricia Dickerson is currently located in [place], [place]    I discussed the limitations of evaluation and management by telemedicine. The patient expressed understanding and agreed to proceed.  S)Pt currently having car trouble so unable to be seen in the clinic today. On phone for fu for AUB. Since oour last visit, she has been on Provera 5 mg daily. Now her cycles are once a month, lasting  5-6 days with the  first 3 days "dark and heavy"- changes products twice a day during her heavy time.  LMP  last week, 12/08/21 [name] is pleased with her currently cycles.  She now is considering the IUD for cycle management and contraception.    O)Pt able to speak clearly, not in distress  A) AUB controlled with Hormones  P) Reviewed that Provera is not intended to be used continuously and is not considered a contraceptive. [name] desires an IUD, briefly reviewed risks/benefits. Offered Cytotec prior to insertion, Mariona prefers to come in while she is on her cycle.   Will continues Provera 5mg  daily until IUD insertion. Script sent to cover her until August.   Reviewed AUB, her previous US that showed a thickened endometrial stripe. At this time I cannot say if this will affect you for your lifetime. It is possible if you lose  around 10 percent of your body weight that you may see improvements. Briefly discussed PCOS, pt denies excessive facial hair or deepening of voice. Most recent pelvic US not suggestive of PCOS. PCOS most likely the cause for her her AUB.   Total time spent with pt   [name], CNM  [facility], [facility]  12/18/21  5:48 PM

## 2021-12-16 NOTE — Telephone Encounter (Signed)
PT is scheduled with LMD on August 10. 2023 at 3:35 for placement of Mirena.

## 2021-12-17 NOTE — Telephone Encounter (Signed)
Noted. Will order to arrive by appointment date/time. 

## 2021-12-28 ENCOUNTER — Other Ambulatory Visit: Payer: Self-pay | Admitting: Licensed Practical Nurse

## 2022-01-02 ENCOUNTER — Ambulatory Visit: Payer: Medicaid Other | Admitting: Licensed Practical Nurse

## 2022-01-03 ENCOUNTER — Other Ambulatory Visit: Payer: Self-pay | Admitting: Licensed Practical Nurse

## 2022-01-03 DIAGNOSIS — N939 Abnormal uterine and vaginal bleeding, unspecified: Secondary | ICD-10-CM

## 2022-01-06 ENCOUNTER — Other Ambulatory Visit: Payer: Self-pay | Admitting: Licensed Practical Nurse

## 2022-01-06 ENCOUNTER — Encounter: Payer: Self-pay | Admitting: Licensed Practical Nurse

## 2022-01-06 ENCOUNTER — Other Ambulatory Visit: Payer: Self-pay

## 2022-01-06 DIAGNOSIS — J019 Acute sinusitis, unspecified: Secondary | ICD-10-CM | POA: Diagnosis not present

## 2022-01-06 DIAGNOSIS — J069 Acute upper respiratory infection, unspecified: Secondary | ICD-10-CM | POA: Diagnosis not present

## 2022-01-06 DIAGNOSIS — Z20822 Contact with and (suspected) exposure to covid-19: Secondary | ICD-10-CM | POA: Diagnosis not present

## 2022-01-06 DIAGNOSIS — N939 Abnormal uterine and vaginal bleeding, unspecified: Secondary | ICD-10-CM

## 2022-01-06 MED ORDER — MEDROXYPROGESTERONE ACETATE 5 MG PO TABS
5.0000 mg | ORAL_TABLET | Freq: Every day | ORAL | 3 refills | Status: DC
Start: 2022-01-06 — End: 2022-04-01

## 2022-01-06 NOTE — Telephone Encounter (Signed)
Pt calling triage and said she was told to call back today if she has not started her cycle so she can get a refill on OCP's. She is currently waiting on getting an IUD. Sent in refill

## 2022-01-07 DIAGNOSIS — R109 Unspecified abdominal pain: Secondary | ICD-10-CM | POA: Diagnosis not present

## 2022-01-07 DIAGNOSIS — R0981 Nasal congestion: Secondary | ICD-10-CM | POA: Diagnosis not present

## 2022-01-07 DIAGNOSIS — Z880 Allergy status to penicillin: Secondary | ICD-10-CM | POA: Diagnosis not present

## 2022-01-07 DIAGNOSIS — R0789 Other chest pain: Secondary | ICD-10-CM | POA: Diagnosis not present

## 2022-01-07 DIAGNOSIS — K219 Gastro-esophageal reflux disease without esophagitis: Secondary | ICD-10-CM | POA: Diagnosis not present

## 2022-01-07 DIAGNOSIS — Z6841 Body Mass Index (BMI) 40.0 and over, adult: Secondary | ICD-10-CM | POA: Diagnosis not present

## 2022-01-07 DIAGNOSIS — R059 Cough, unspecified: Secondary | ICD-10-CM | POA: Diagnosis not present

## 2022-01-07 DIAGNOSIS — Z79899 Other long term (current) drug therapy: Secondary | ICD-10-CM | POA: Diagnosis not present

## 2022-01-31 DIAGNOSIS — B349 Viral infection, unspecified: Secondary | ICD-10-CM | POA: Diagnosis not present

## 2022-01-31 DIAGNOSIS — R110 Nausea: Secondary | ICD-10-CM | POA: Diagnosis not present

## 2022-01-31 DIAGNOSIS — F419 Anxiety disorder, unspecified: Secondary | ICD-10-CM | POA: Diagnosis not present

## 2022-01-31 DIAGNOSIS — K219 Gastro-esophageal reflux disease without esophagitis: Secondary | ICD-10-CM | POA: Diagnosis not present

## 2022-01-31 DIAGNOSIS — R519 Headache, unspecified: Secondary | ICD-10-CM | POA: Diagnosis not present

## 2022-01-31 DIAGNOSIS — Z20822 Contact with and (suspected) exposure to covid-19: Secondary | ICD-10-CM | POA: Diagnosis not present

## 2022-03-27 ENCOUNTER — Ambulatory Visit: Payer: Medicaid Other | Admitting: Licensed Practical Nurse

## 2022-04-01 ENCOUNTER — Other Ambulatory Visit: Payer: Self-pay | Admitting: Licensed Practical Nurse

## 2022-04-01 ENCOUNTER — Ambulatory Visit: Payer: Medicaid Other | Admitting: Licensed Practical Nurse

## 2022-04-01 ENCOUNTER — Other Ambulatory Visit: Payer: Self-pay

## 2022-04-01 MED ORDER — MEDROXYPROGESTERONE ACETATE 5 MG PO TABS: 5.0000 mg | ORAL_TABLET | Freq: Every day | ORAL | 0 refills | Status: DC

## 2022-04-01 NOTE — Telephone Encounter (Signed)
Pt calling for a refill of her prescription to get her to her appt.  [phone]  pt needs refill of medroxyprogesterone 5mg  tablet.  Has appt 12/20th for IUD insertion.

## 2022-05-04 ENCOUNTER — Other Ambulatory Visit: Payer: Self-pay | Admitting: Licensed Practical Nurse

## 2022-05-05 ENCOUNTER — Other Ambulatory Visit: Payer: Self-pay | Admitting: Licensed Practical Nurse

## 2022-05-14 ENCOUNTER — Encounter: Payer: Self-pay | Admitting: Licensed Practical Nurse

## 2022-05-14 ENCOUNTER — Ambulatory Visit: Payer: Medicaid Other | Admitting: Licensed Practical Nurse

## 2022-05-14 VITALS — BP 185/91 | HR 80 | Ht 74.0 in | Wt >= 6400 oz

## 2022-05-14 DIAGNOSIS — N926 Irregular menstruation, unspecified: Secondary | ICD-10-CM

## 2022-05-14 DIAGNOSIS — Z3202 Encounter for pregnancy test, result negative: Secondary | ICD-10-CM | POA: Diagnosis not present

## 2022-05-14 DIAGNOSIS — Z30430 Encounter for insertion of intrauterine contraceptive device: Secondary | ICD-10-CM

## 2022-05-14 DIAGNOSIS — N939 Abnormal uterine and vaginal bleeding, unspecified: Secondary | ICD-10-CM

## 2022-05-14 LAB — POCT URINE PREGNANCY: Preg Test, Ur: NEGATIVE

## 2022-05-14 MED ORDER — MEDROXYPROGESTERONE ACETATE 10 MG PO TABS: 10.0000 mg | ORAL_TABLET | Freq: Every day | ORAL | 6 refills | Status: AC

## 2022-05-14 NOTE — Progress Notes (Signed)
Reviewed visit with Dr Jacqlyn Krauss to remain in Provera daily versus for only part of the cycle, however because the pt does still bleed with Provera, this may not be the best option for her. Nexplanon and IUD are both good options. The IUD can be inserted using anesthesia or medication to decrease anxiety if needed.   TC to [place].  Reviewed above conversation.  Encouraged Keani to schedule consult with Dr Truman Hayward to review options.  Will increase Provera to 10mg  daily.  Script sent     [facility]  05/14/22  2:32 PM

## 2022-05-14 NOTE — Progress Notes (Signed)
SUBJECTIVE: Here for Mirena IUD insertion. She did take Ibuprofen this morning. She did have IC in November.  Patricia Dickerson has been taking 5mg  Provera daily, she will get a cycle about every 28 days, that lasts 2-3days, the first day is brown discharge then the next 2 days are "heavy" she uses 2-3 pads per days, does have cramping does not pass clots. This is an improvement  Pt wonders why she has this bleeding and why her uterine lining is so thick.   Reviewed pt's hx with Dr , at this time there is not a need for an endometrial biopsy, labs are not needed, and IUD is the best option for this pt.   OBJECTIVE: BP (!) 185/91   Pulse 80   Ht 6\' 2"  (1.88 m)   Wt (!) 481 lb 9.6 oz (218.5 kg)   BMI 61.83 kg/m   Patricia Dickerson consented to procedure.  Positioned to reclined with feet in stirrups, Bimanual exam performed, unable to fully palpate uterus d/t pt's habitus.  Attempted to insert long speculum, once speculum opened pt unable to tolerate d.t pressure/pain, some bleeding noted. Attempted to insert graves speculum unable to fully introduce speculum as pt reported pain and requested the procedure to stop.  After some minutes to rest, pt stated she no longer wanted the IUD, she "did not really want it in the first place".   Pt does desire to control her bleeding. States she is not sexually active, having IC in November was a "one off". But is aware that provera does not protect against pregnancy.  Reviewed options, IUD, Pills, Depo,.or Nexplanon, [name] prefers to remain on Provera, undecided about contraception.   ASSESSMENT: AUB Failed IUD insertion   PLAN: Will continue with Provera This cnm to discuss treatment options/plan with MD and call pt with recommendations

## 2022-06-04 ENCOUNTER — Other Ambulatory Visit: Payer: Self-pay | Admitting: Licensed Practical Nurse

## 2022-07-03 DIAGNOSIS — J069 Acute upper respiratory infection, unspecified: Secondary | ICD-10-CM | POA: Diagnosis not present

## 2022-07-03 DIAGNOSIS — R197 Diarrhea, unspecified: Secondary | ICD-10-CM | POA: Diagnosis not present

## 2022-07-03 DIAGNOSIS — R509 Fever, unspecified: Secondary | ICD-10-CM | POA: Diagnosis not present

## 2022-07-07 ENCOUNTER — Encounter: Payer: Medicaid Other | Admitting: Obstetrics and Gynecology

## 2022-09-02 ENCOUNTER — Encounter: Payer: Medicaid Other | Admitting: Obstetrics and Gynecology

## 2022-10-06 ENCOUNTER — Telehealth: Payer: Self-pay | Admitting: Family Medicine

## 2022-10-06 NOTE — Telephone Encounter (Signed)
Patricia Dickerson (mother) called stating that pt would like to get established with Dr. Laury Axon as a pt. Patricia Dickerson is a current pt of Dr. Laury Axon. Please Advise.

## 2022-10-07 NOTE — Telephone Encounter (Signed)
Spoke to patient and explained that the [facility] insurance is showing inactive. She stated she would call insurance and then call back to make appt.

## 2022-10-07 NOTE — Telephone Encounter (Signed)
Patricia Dickerson (mother) called to check status of approval. Will have pt call back with insurance info to verify Medicaid PCP assignment.

## 2023-01-04 IMAGING — DX DG CHEST 2V
2 series · 2 of 2 positions shown · non-contrast
Comparison: October 26, 2014

CLINICAL DATA: Chest discomfort.

EXAM:
CHEST - 2 VIEW

[chest pa]
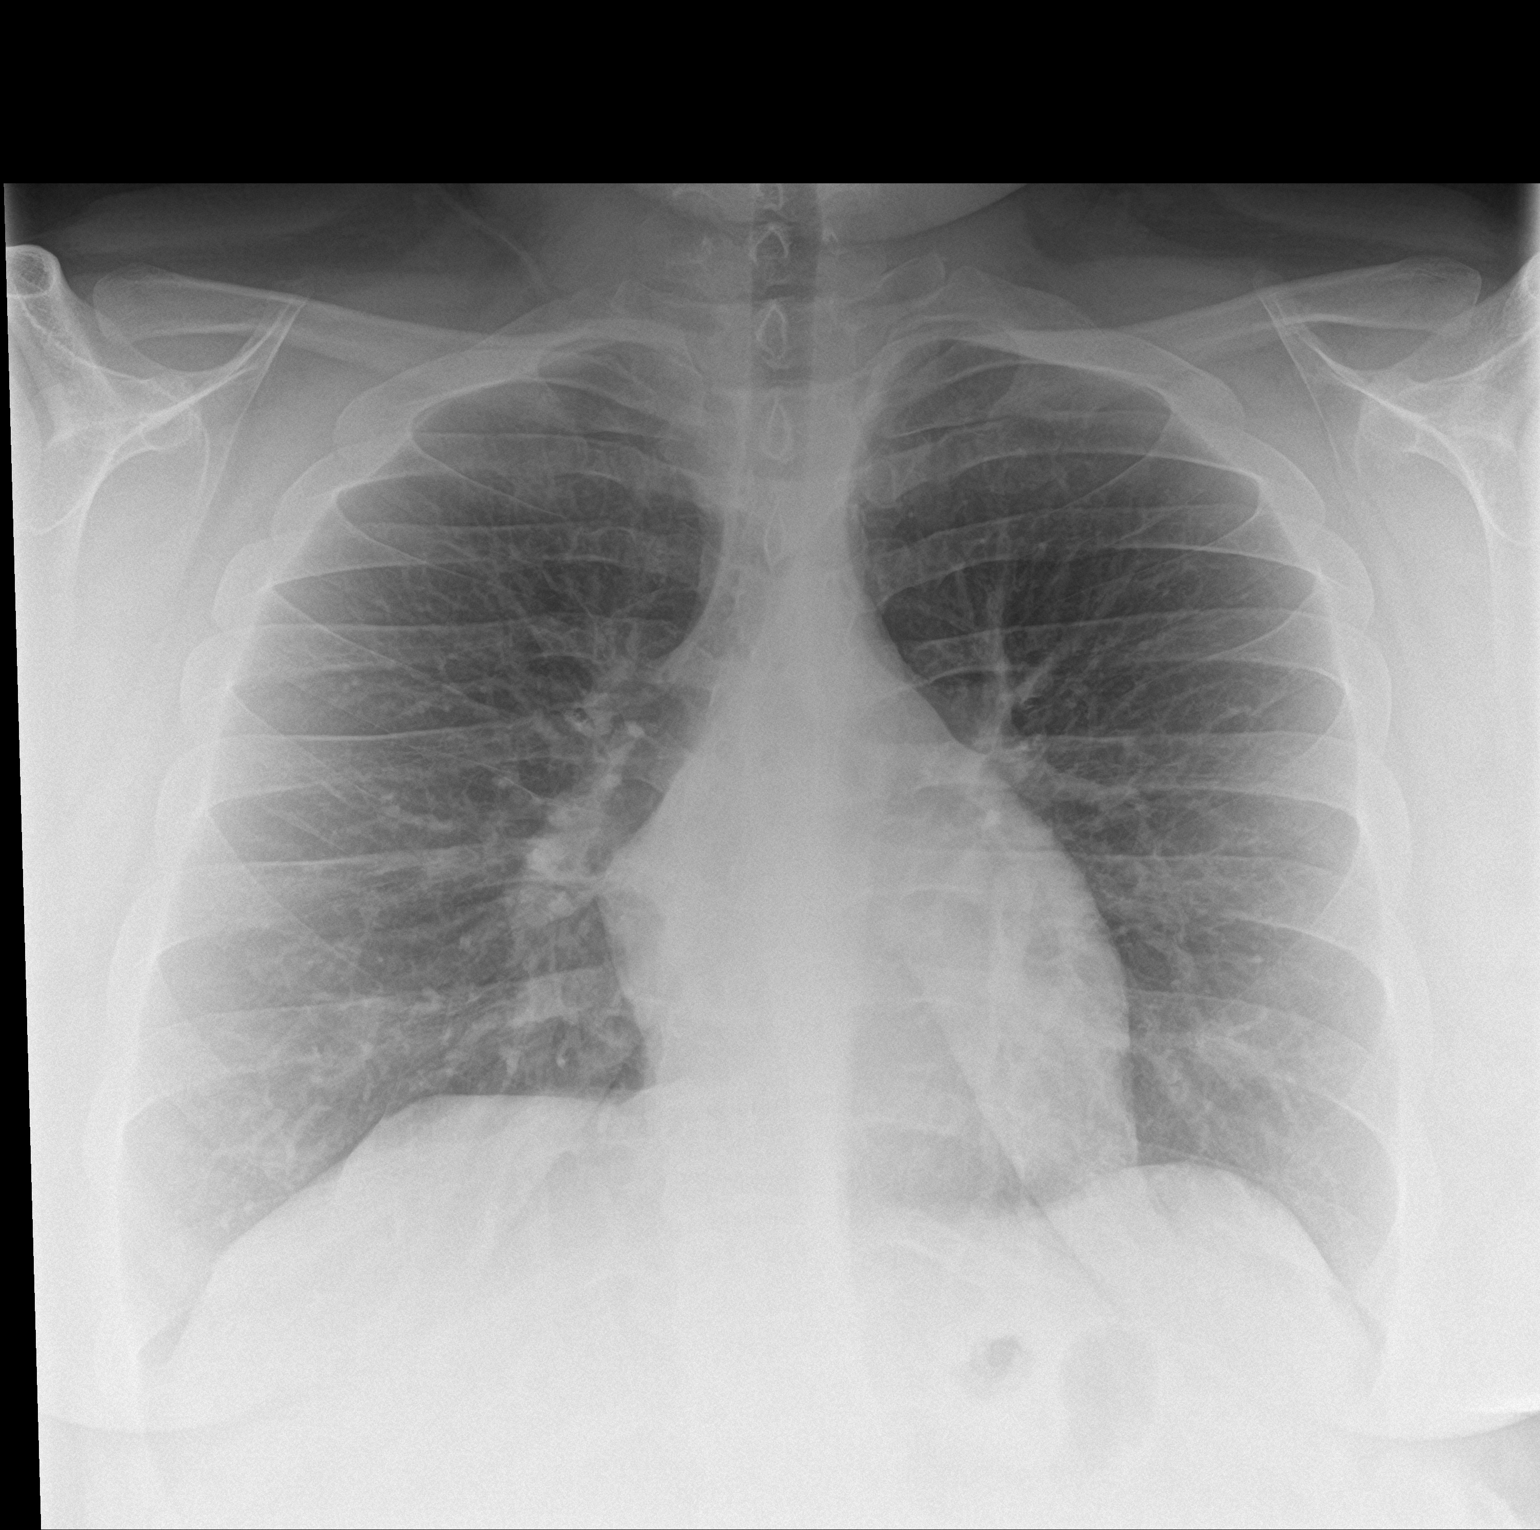

[chest lat]
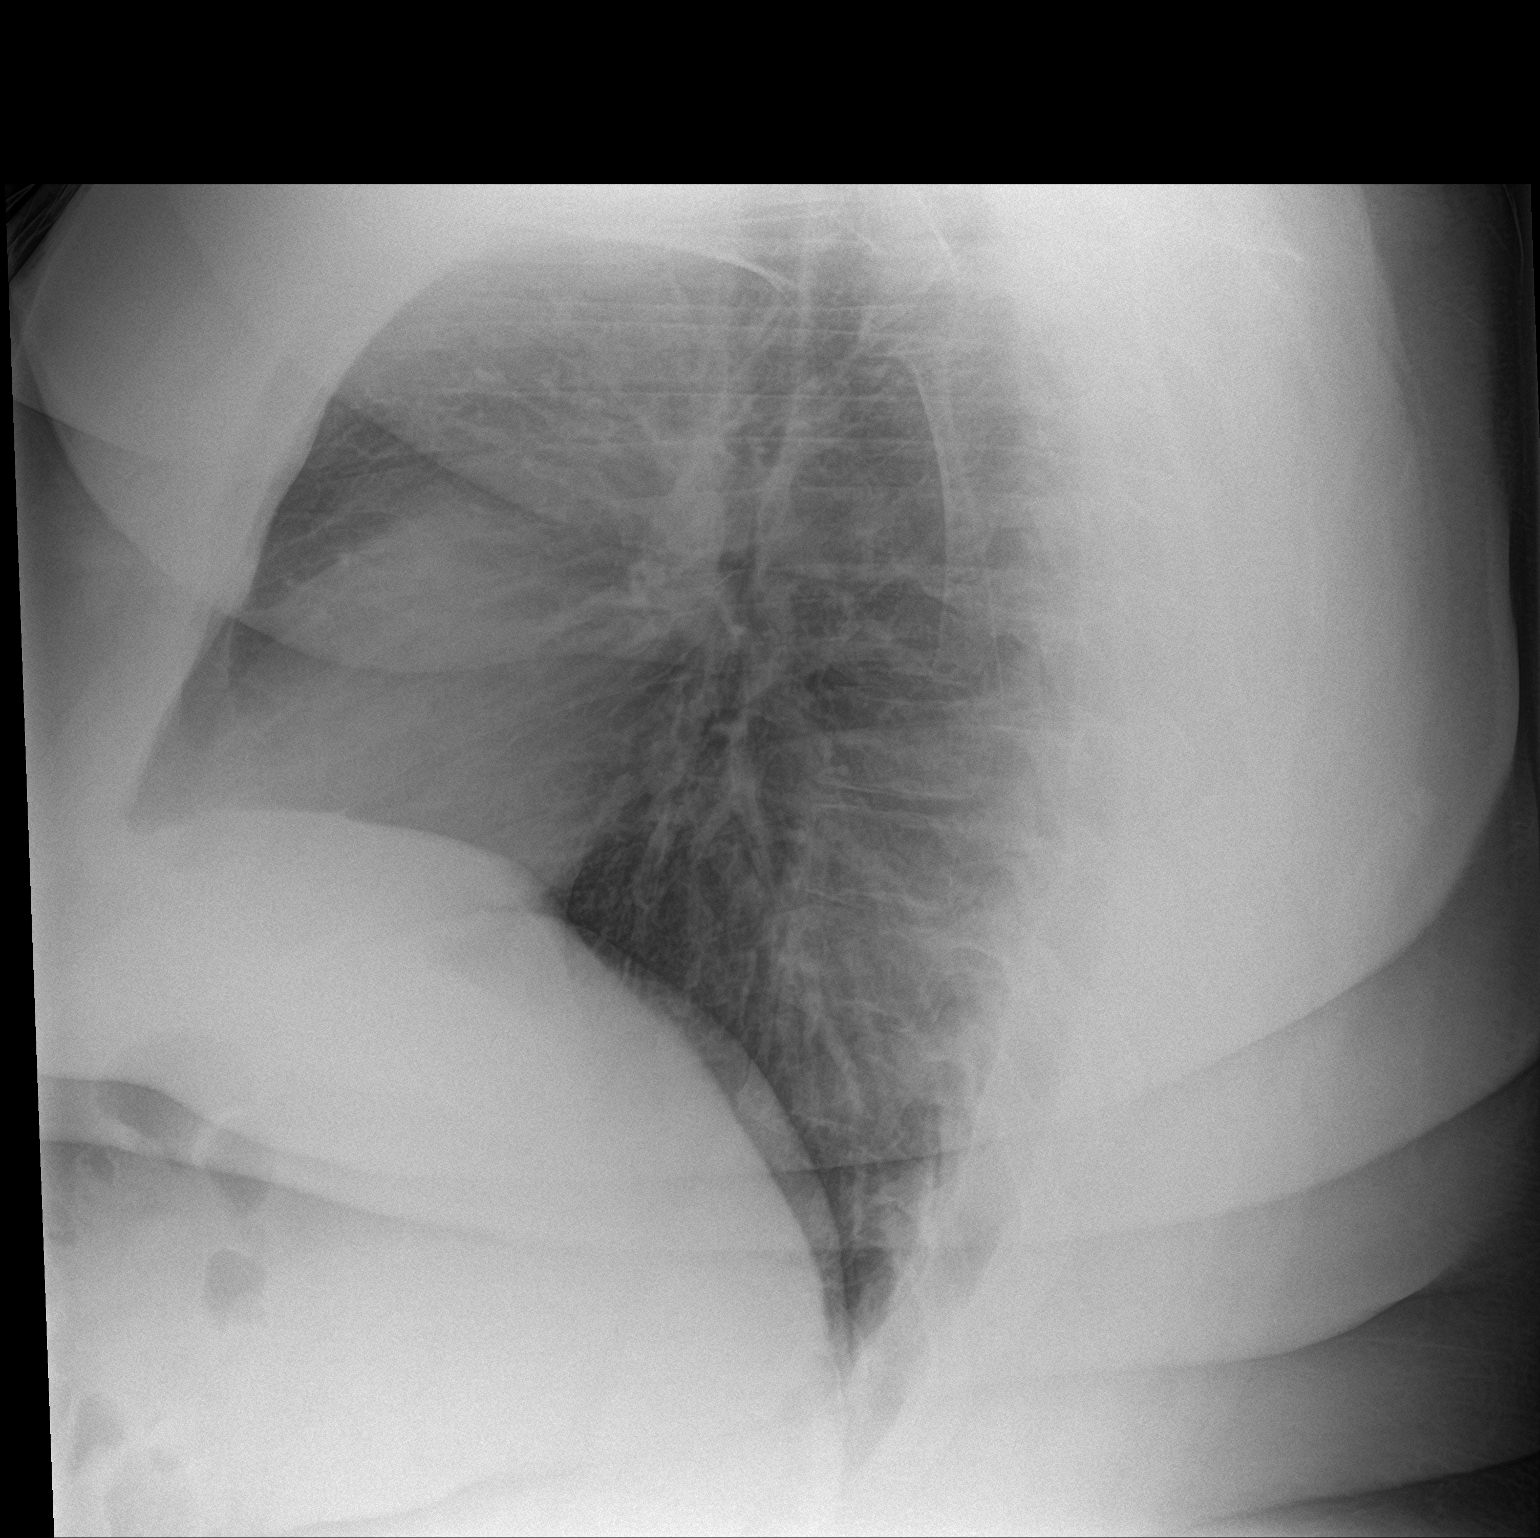

[2 of 2 positions shown; findings below may reference images not displayed]

FINDINGS: The heart size and mediastinal contours are within normal limits.
Both lungs are clear. The visualized skeletal structures are
unremarkable.
IMPRESSION: No active cardiopulmonary disease.

## 2023-03-28 IMAGING — US US PELVIS COMPLETE WITH TRANSVAGINAL
1 series · 13 of 25 positions shown · non-contrast
Comparison: None available.

CLINICAL DATA: Initial evaluation for irregular menstrual bleeding.

EXAM:
TRANSABDOMINAL AND TRANSVAGINAL ULTRASOUND OF PELVIS
TECHNIQUE: Both transabdominal and transvaginal ultrasound examinations of the
pelvis were performed. Transabdominal technique was performed for
global imaging of the pelvis including uterus, ovaries, adnexal
regions, and pelvic cul-de-sac. It was necessary to proceed with
endovaginal exam following the transabdominal exam to visualize the
endometrium and ovaries.

[Series 1: us pelvic complete with transvaginal · 13 of 107 slices shown]
[im 1/107]
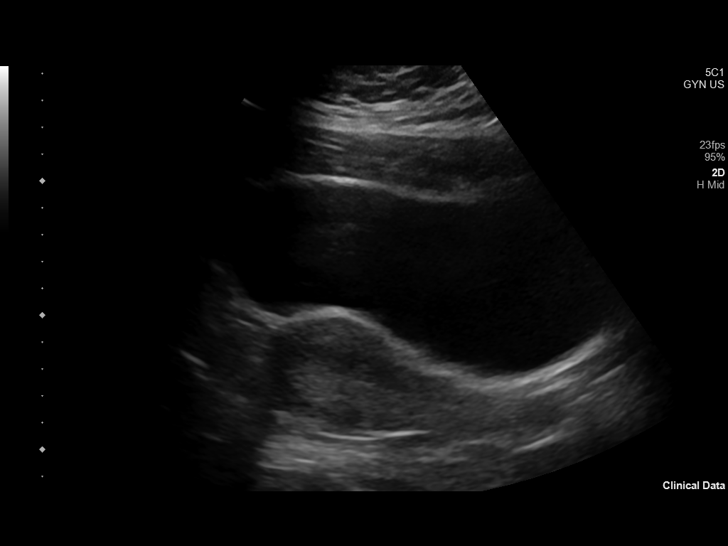
[im 9/107]
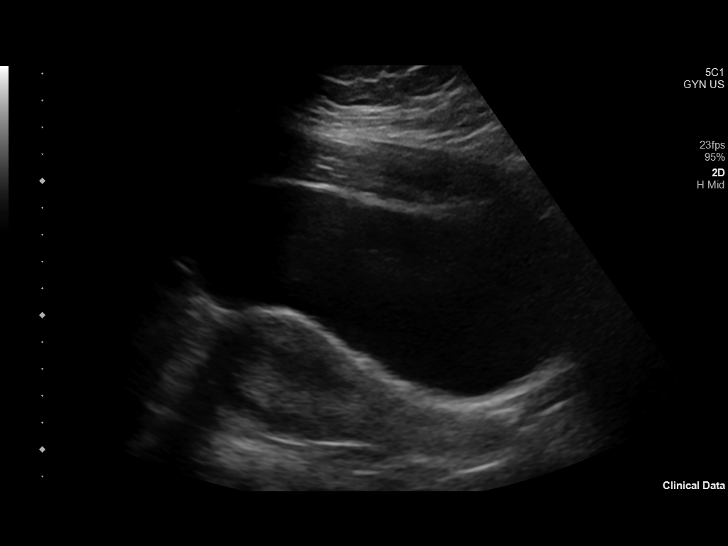
[im 18/107]
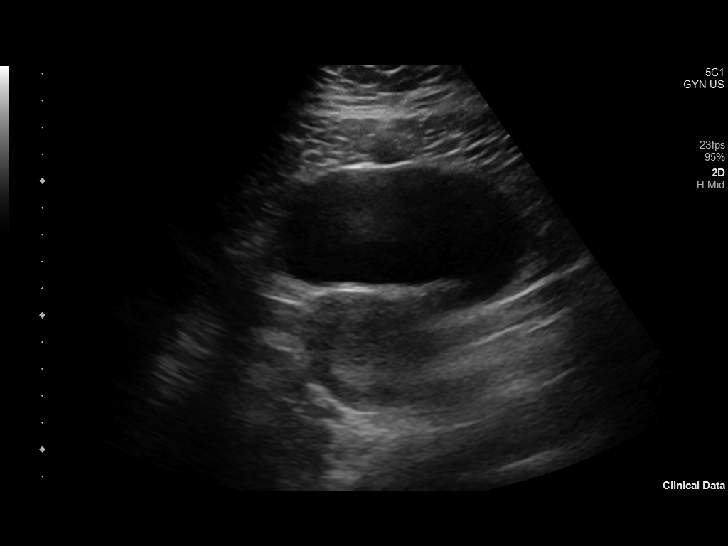
[im 27/107]
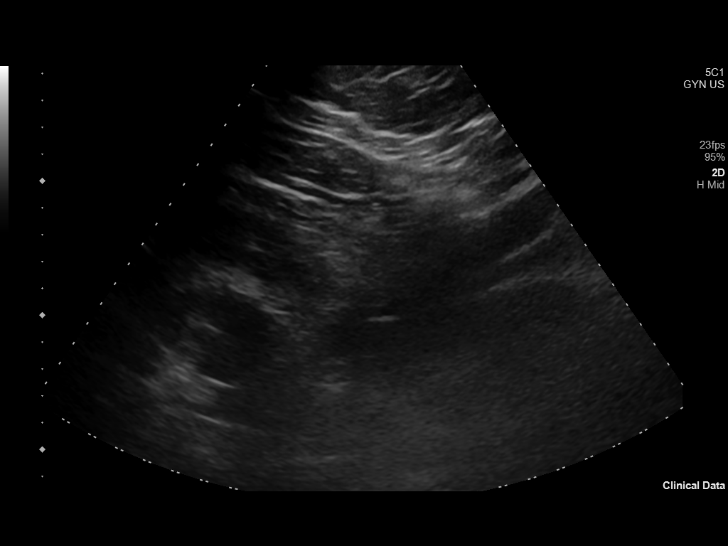
[im 36/107]
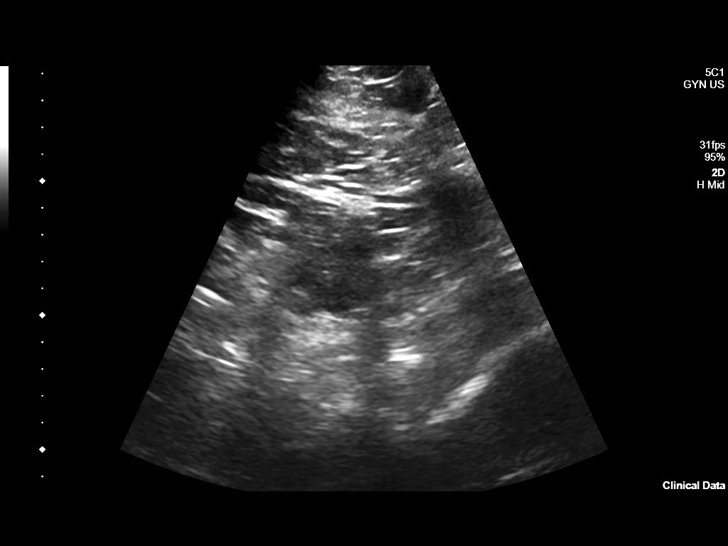
[im 45/107]
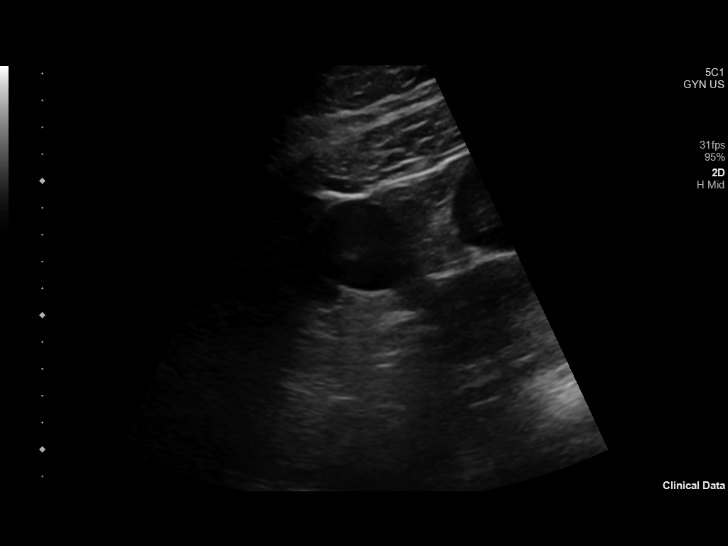
[im 54/107]
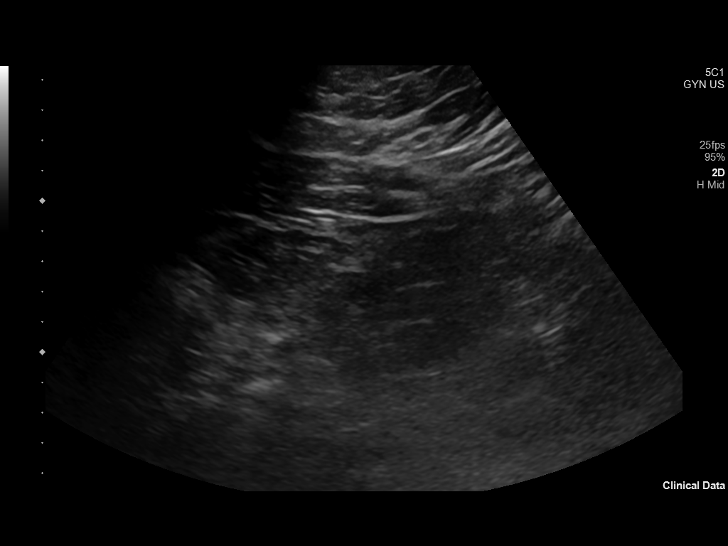
[im 62/107]
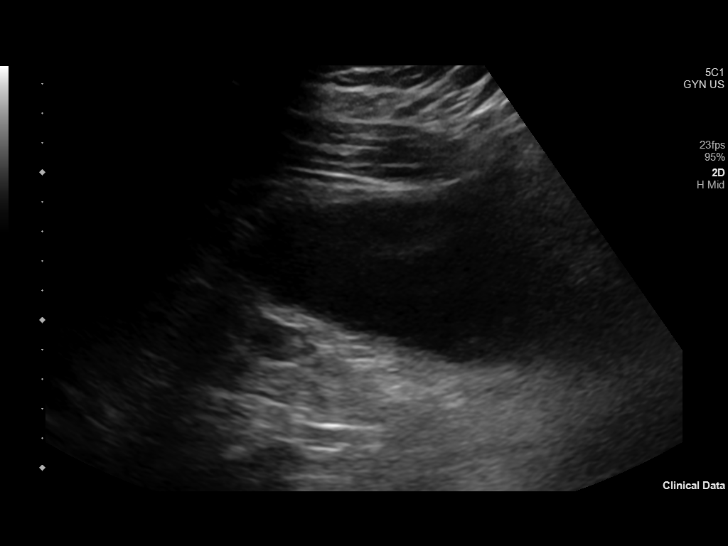
[im 71/107]
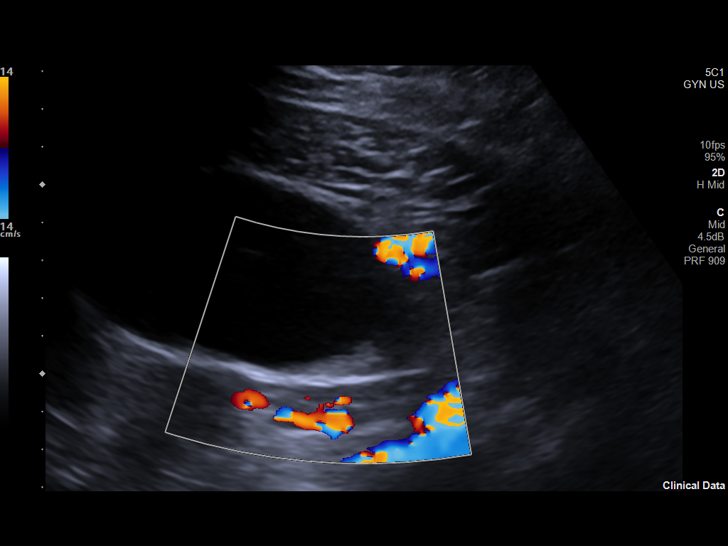
[im 80/107]
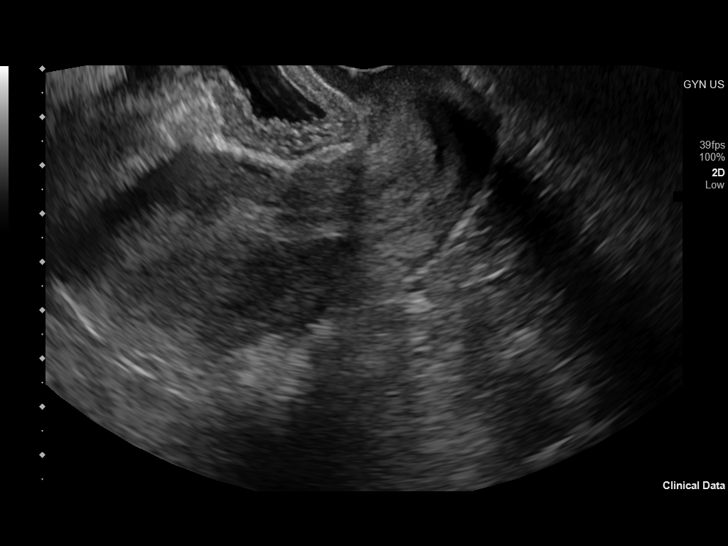
[im 89/107]
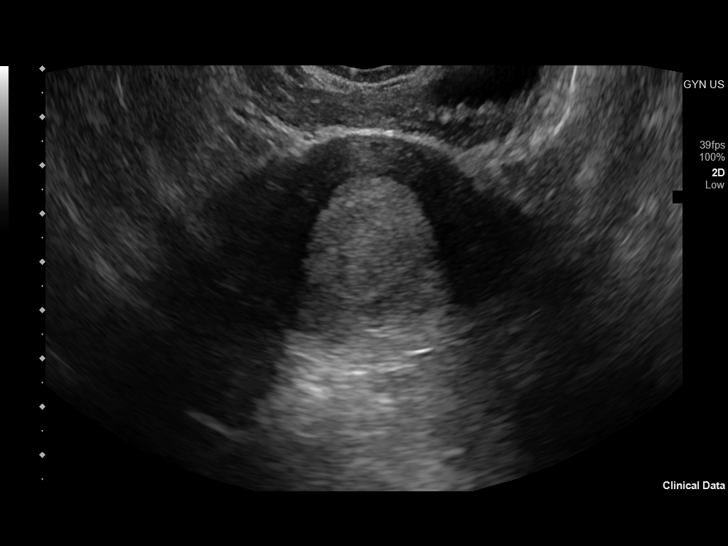
[im 98/107]
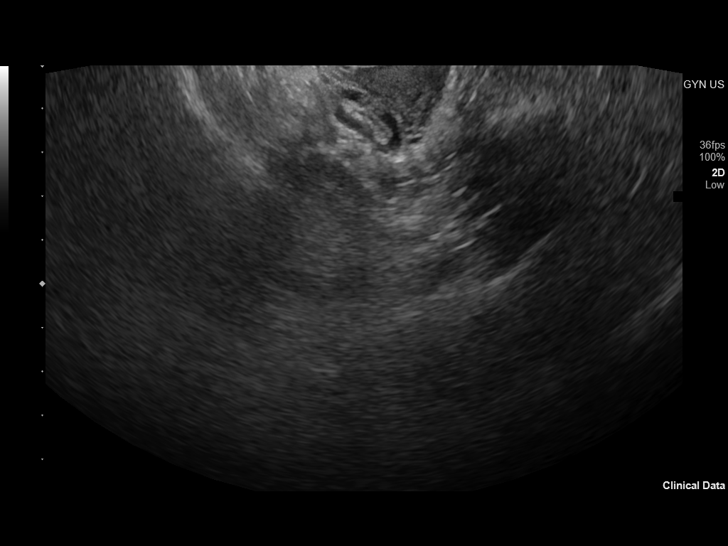
[im 107/107]
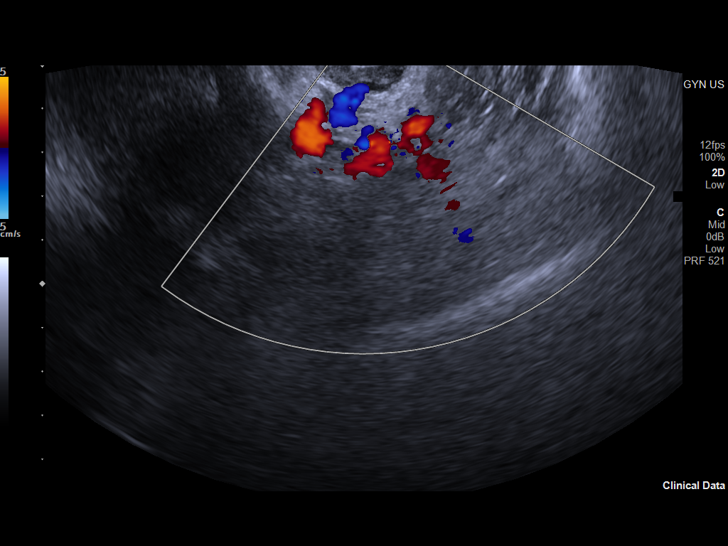

[13 of 25 positions shown; findings below may reference images not displayed]

FINDINGS: Uterus

Measurements: 9.4 x 4.3 x 4.3 cm = volume: 91.0 mL. Uterus is
anteverted. No discrete fibroid or other mass.

Endometrium

Thickness: 24.5 mm.  No focal abnormality or abnormal vascularity.

Right ovary

Measurements: 4.1 x 4.8 x 3.6 cm = volume: 43.4 mL. 3.4 x 3.2 x
cm simple cyst. No significant internal complexity, vascularity, or
solid nodularity.

Left ovary

Measurements: 2.9 x 1.2 x 2.6 cm = volume: 4.7 mL. Normal
appearance/no adnexal mass.

Other findings

No abnormal free fluid.
IMPRESSION: 1. Endometrial stripe thickened up to 24.5 mm. If bleeding remains
unresponsive to hormonal or medical therapy, focal lesion work-up
with sonohysterogram should be considered. Endometrial biopsy should
also be considered in pre-menopausal patients at high risk for
endometrial carcinoma. (Ref: Radiological Reasoning: Algorithmic
Workup of Abnormal Vaginal Bleeding with Endovaginal Sonography and
Sonohysterography. AJR 1559; 191:S68-73).
2. Otherwise normal sonographic appearance of the uterus.
3. 3.9 cm simple right ovarian cyst, likely benign given size and
appearance. No follow up imaging recommended. Note: This
recommendation does not apply to premenarchal patients or to those
with increased risk (genetic, family history, elevated tumor markers
or other high-risk factors) of ovarian cancer. Reference: Radiology
[DATE]):[id number].

## 2023-06-19 ENCOUNTER — Emergency Department (HOSPITAL_BASED_OUTPATIENT_CLINIC_OR_DEPARTMENT_OTHER)
Admission: EM | Admit: 2023-06-19 | Discharge: 2023-06-19 | Disposition: A | Discharge status: Home / Self Care | Payer: BLUE CROSS/BLUE SHIELD | Attending: Emergency Medicine | Admitting: Emergency Medicine

## 2023-06-19 ENCOUNTER — Other Ambulatory Visit: Payer: Self-pay

## 2023-06-19 ENCOUNTER — Encounter (HOSPITAL_BASED_OUTPATIENT_CLINIC_OR_DEPARTMENT_OTHER): Payer: Self-pay | Admitting: Urology

## 2023-06-19 DIAGNOSIS — Z20822 Contact with and (suspected) exposure to covid-19: Secondary | ICD-10-CM | POA: Insufficient documentation

## 2023-06-19 DIAGNOSIS — R519 Headache, unspecified: Secondary | ICD-10-CM | POA: Diagnosis present

## 2023-06-19 LAB — RESP PANEL BY RT-PCR (RSV, FLU A&B, COVID)  RVPGX2
Influenza A by PCR: NEGATIVE
Influenza B by PCR: NEGATIVE
Resp Syncytial Virus by PCR: NEGATIVE
SARS Coronavirus 2 by RT PCR: NEGATIVE

## 2023-06-19 MED ORDER — LIDOCAINE VISCOUS HCL 2 % MT SOLN
15.0000 mL | Freq: Once | OROMUCOSAL | Status: AC
  Administered 2023-06-19: 15 mL via OROMUCOSAL
  Filled 2023-06-19: qty 15

## 2023-06-19 MED ORDER — KETOROLAC TROMETHAMINE 30 MG/ML IJ SOLN
30.0000 mg | Freq: Once | INTRAMUSCULAR | Status: AC
  Administered 2023-06-19: 30 mg via INTRAVENOUS
  Filled 2023-06-19: qty 1

## 2023-06-19 MED ORDER — METOCLOPRAMIDE HCL 5 MG/ML IJ SOLN
10.0000 mg | Freq: Once | INTRAMUSCULAR | Status: AC
  Administered 2023-06-19: 10 mg via INTRAVENOUS
  Filled 2023-06-19: qty 2

## 2023-06-19 MED ORDER — ACETAMINOPHEN 500 MG PO TABS
1000.0000 mg | ORAL_TABLET | Freq: Once | ORAL | Status: AC
  Administered 2023-06-19: 1000 mg via ORAL
  Filled 2023-06-19: qty 2

## 2023-06-19 NOTE — ED Triage Notes (Signed)
Pt sent from [facility], states HA since Saturday  Had Dizziness this am and state HTN at [facility]  155/88 at [facility]  No light or sound sensitivity  Took 500mg  tylenol with no relief  Not on BP meds

## 2023-06-19 NOTE — Discharge Instructions (Addendum)
I would recommend taking Motrin and Tylenol at home.  Drink plenty of water.  Follow-up with your PCP.  Return to the emergency department if you have any sudden worsening of your headache, difficulty with your vision, lose consciousness, develop weakness or have trouble walking.

## 2023-06-19 NOTE — ED Provider Notes (Signed)
[place] [facility] Provider Note  CSN: [id number] Arrival date & time: 06/19/23 1423  Chief Complaint(s) Headache  HPI Patricia Dickerson is a 23 y.o. female here today for headache.  Patient does not usually get headaches, started 6 days ago.  No trauma.  Has not had any fever or chills.  Denies any blurred vision or difficulty with her vision.   Past Medical History Past Medical History:  Diagnosis Date   Vitamin D deficiency    Patient Active Problem List   Diagnosis Date Noted   Abnormal uterine bleeding 07/08/2021   Palpitations 03/16/2020   Mixed hyperlipidemia 03/02/2020   Family history of breast cancer 11/02/2019   Dysmenorrhea 11/02/2019   Vitamin D deficiency    Anxiety 10/04/2018   Severe obesity due to excess calories without serious comorbidity with body mass index (BMI) greater than 99th percentile for age in pediatric patient (HCC) 11/10/2016   Headache in front of head 01/12/2014   Gastroesophageal reflux 12/26/2013   Home Medication(s) Prior to Admission medications   Medication Sig Start Date End Date Taking? Authorizing Provider  busPIRone (BUSPAR) 7.5 MG tablet Take 1 tablet once daily at night, may take 1 add'l tab during the day as needed for anxiety. Patient not taking: Reported on 12/16/2021 10/04/18   Doren Custard, FNP  escitalopram (LEXAPRO) 10 MG tablet Take 1 tablet (10 mg total) by mouth daily. Take 1/2 tablet for first week Patient not taking: Reported on 05/14/2022 03/16/20   Jamelle Haring, MD  hydrOXYzine (ATARAX/VISTARIL) 25 MG tablet Take 1 tablet (25 mg total) by mouth every 6 (six) hours. Patient not taking: Reported on 12/16/2021 04/10/21   Mannie Stabile, PA-C  ibuprofen (ADVIL) 800 MG tablet Take 1 tablet (800 mg total) by mouth every 8 (eight) hours as needed. Patient not taking: Reported on 08/19/2021 11/02/19   Copland, Ilona Sorrel, PA-C  medroxyPROGESTERone (PROVERA) 10 MG tablet Take 1  tablet (10 mg total) by mouth daily. 05/14/22   Dominic, Courtney Heys, CNM  omeprazole (PRILOSEC) 40 MG capsule Take 1 capsule (40 mg total) by mouth daily. 03/16/20   Jamelle Haring, MD                                                                                                                                    Past Surgical History Past Surgical History:  Procedure Laterality Date   MOUTH SURGERY     Family History Family History  Problem Relation Age of Onset   Thyroid disease Mother    Diabetes Father    Breast cancer Paternal Aunt        29s   Breast cancer Paternal Grandmother        not sure of age    Social History Social History   Tobacco Use   Smoking status: Never   Smokeless tobacco: Never  Vaping Use  Vaping status: Never Used  Substance Use Topics   Alcohol use: Not Currently   Drug use: Never   Allergies Amoxicillin  Review of Systems Review of Systems  Physical Exam Vital Signs  I have reviewed the triage vital signs BP (!) 140/56   Pulse 61   Temp 98.6 F (37 C) (Oral)   Resp 18   Ht 6\' 2"  (1.88 m)   Wt (!) 218.5 kg   SpO2 100%   BMI 61.85 kg/m   Physical Exam Vitals reviewed.  Eyes:     Extraocular Movements: Extraocular movements intact.  Cardiovascular:     Rate and Rhythm: Normal rate.  Pulmonary:     Effort: Pulmonary effort is normal.  Neurological:     Mental Status: She is alert and oriented to person, place, and time.     Cranial Nerves: No cranial nerve deficit or dysarthria.     ED Results and Treatments Labs (all labs ordered are listed, but only abnormal results are displayed) Labs Reviewed  RESP PANEL BY RT-PCR (RSV, FLU A&B, COVID)  RVPGX2                                                                                                                          Radiology No results found.  Pertinent labs & imaging results that were available during my care of the patient were reviewed by me and  considered in my medical decision making (see MDM for details).  Medications Ordered in ED Medications  ketorolac (TORADOL) 30 MG/ML injection 30 mg (30 mg Intravenous Given 06/19/23 1819)  metoCLOPramide (REGLAN) injection 10 mg (10 mg Intravenous Given 06/19/23 1819)  acetaminophen (TYLENOL) tablet 1,000 mg (1,000 mg Oral Given 06/19/23 1802)  lidocaine (XYLOCAINE) 2 % viscous mouth solution 15 mL (15 mLs Mouth/Throat Given 06/19/23 1802)                                                                                                                                     Procedures Procedures  (including critical care time)  Medical Decision Making / ED Course   This patient presents to the ED for concern of headache, this involves an extensive number of treatment options, and is a complaint that carries with it a high risk of complications and morbidity.  The differential diagnosis includes tension type headache, migraine type headache, less likely ICH, less likely IIH  MDM: Patient with no red flag symptoms of headache, overall looks well.  Will symptomatically treat the patient.  Considered imaging on this patient given her lack of history of headache, however believe the risk of radiation to this patient exceeds likelihood of underlying etiology.  Considered IIH in this patient given obesity, however she has no visual symptoms.  Do not believe labs are warranted at this time.  Reassessment 7 PM-patient's headache significantly improved.  Continues to have no neurological deficits or visual deficiencies.  Will discharge patient, have her follow-up with her PCP.  Return precautions discussed with patient at bedside.   Additional history obtained: -Additional history obtained from  -External records from outside source obtained and reviewed including: Chart review including previous notes, labs, imaging, consultation notes   Lab Tests: -I ordered, reviewed, and interpreted labs.   The  pertinent results include:   Labs Reviewed  RESP PANEL BY RT-PCR (RSV, FLU A&B, COVID)  RVPGX2      EKG   EKG Interpretation Date/Time:    Ventricular Rate:    PR Interval:    QRS Duration:    QT Interval:    QTC Calculation:   R Axis:      Text Interpretation:          Medicines ordered and prescription drug management: Meds ordered this encounter  Medications   ketorolac (TORADOL) 30 MG/ML injection 30 mg   metoCLOPramide (REGLAN) injection 10 mg   acetaminophen (TYLENOL) tablet 1,000 mg   lidocaine (XYLOCAINE) 2 % viscous mouth solution 15 mL    -I have reviewed the patients home medicines and have made adjustments as needed   Cardiac Monitoring: The patient was maintained on a cardiac monitor.  I personally viewed and interpreted the cardiac monitored which showed an underlying rhythm of: My independent review the patient's EKG shows no ST segment depressions or elevations, no T wave versions, no evidence of acute ischemia.  Social Determinants of Health:  Factors impacting patients care include:    Reevaluation: After the interventions noted above, I reevaluated the patient and found that they have :improved  Co morbidities that complicate the patient evaluation  Past Medical History:  Diagnosis Date   Vitamin D deficiency       Dispostion: I considered admission for this patient, however with the patient's improvement believe she is appropriate for outpatient management     Final Clinical Impression(s) / ED Diagnoses Final diagnoses:  Bad headache     @PCDICTATION @    Anders Simmonds T, DO 06/19/23 1905

## 2023-06-19 NOTE — ED Notes (Signed)
Pt has ulcer in her mouth causing severe headache and requesting treatment for same.

## 2023-12-22 NOTE — Telephone Encounter (Signed)
 Patient called the clinic, DOB verified, reports not feeling well while taking Levaquin and would like to change antibiotics. Review of her chart shows allergy to Amoxicillin. She has taken Cefdinir in the past so we will change meds to Cefdinir for 10 days. Advised to stop Levaquin today. All questions were answered.

## 2023-12-23 ENCOUNTER — Emergency Department (HOSPITAL_BASED_OUTPATIENT_CLINIC_OR_DEPARTMENT_OTHER)

## 2023-12-23 ENCOUNTER — Other Ambulatory Visit: Payer: Self-pay

## 2023-12-23 ENCOUNTER — Emergency Department (HOSPITAL_BASED_OUTPATIENT_CLINIC_OR_DEPARTMENT_OTHER)
Admission: EM | Admit: 2023-12-23 | Discharge: 2023-12-23 | Disposition: A | Discharge status: Home / Self Care | Attending: Emergency Medicine | Admitting: Emergency Medicine

## 2023-12-23 ENCOUNTER — Encounter (HOSPITAL_BASED_OUTPATIENT_CLINIC_OR_DEPARTMENT_OTHER): Payer: Self-pay | Admitting: Radiology

## 2023-12-23 DIAGNOSIS — J45909 Unspecified asthma, uncomplicated: Secondary | ICD-10-CM | POA: Insufficient documentation

## 2023-12-23 DIAGNOSIS — R0602 Shortness of breath: Secondary | ICD-10-CM | POA: Insufficient documentation

## 2023-12-23 LAB — CBC WITH DIFFERENTIAL/PLATELET
Abs Immature Granulocytes: 0.01 K/uL (ref 0.00–0.07)
Basophils Absolute: 0 K/uL (ref 0.0–0.1)
Basophils Relative: 1 %
Eosinophils Absolute: 0.1 K/uL (ref 0.0–0.5)
Eosinophils Relative: 1 %
HCT: 40.3 % (ref 36.0–46.0)
Hemoglobin: 13.7 g/dL (ref 12.0–15.0)
Immature Granulocytes: 0 %
Lymphocytes Relative: 35 %
Lymphs Abs: 2.9 K/uL (ref 0.7–4.0)
MCH: 29.7 pg (ref 26.0–34.0)
MCHC: 34 g/dL (ref 30.0–36.0)
MCV: 87.2 fL (ref 80.0–100.0)
Monocytes Absolute: 0.4 K/uL (ref 0.1–1.0)
Monocytes Relative: 5 %
Neutro Abs: 4.8 K/uL (ref 1.7–7.7)
Neutrophils Relative %: 58 %
Platelets: 365 K/uL (ref 150–400)
RBC: 4.62 MIL/uL (ref 3.87–5.11)
RDW: 12.1 % (ref 11.5–15.5)
WBC: 8.2 K/uL (ref 4.0–10.5)
nRBC: 0 % (ref 0.0–0.2)

## 2023-12-23 LAB — COMPREHENSIVE METABOLIC PANEL WITH GFR
ALT: 17 U/L (ref 0–44)
AST: 18 U/L (ref 15–41)
Albumin: 4.5 g/dL (ref 3.5–5.0)
Alkaline Phosphatase: 52 U/L (ref 38–126)
Anion gap: 11 (ref 5–15)
BUN: 11 mg/dL (ref 6–20)
CO2: 22 mmol/L (ref 22–32)
Calcium: 9.6 mg/dL (ref 8.9–10.3)
Chloride: 107 mmol/L (ref 98–111)
Creatinine, Ser: 0.82 mg/dL (ref 0.44–1.00)
GFR, Estimated: >60 mL/min (ref >60)
Glucose, Bld: 110 mg/dL — ABNORMAL HIGH (ref 70–99)
Potassium: 4 mmol/L (ref 3.5–5.1)
Sodium: 141 mmol/L (ref 135–145)
Total Bilirubin: 0.4 mg/dL (ref 0.0–1.2)
Total Protein: 7 g/dL (ref 6.5–8.1)

## 2023-12-23 LAB — PREGNANCY, URINE: Preg Test, Ur: NEGATIVE

## 2023-12-23 LAB — SARS CORONAVIRUS 2 BY RT PCR: SARS Coronavirus 2 by RT PCR: NEGATIVE

## 2023-12-23 LAB — D-DIMER, QUANTITATIVE: D-Dimer, Quant: <0.27 ug{FEU}/mL (ref 0.00–0.50)

## 2023-12-23 NOTE — ED Triage Notes (Signed)
 Pt states that for the past three days she has had shortness of breath. She states she is currently being treated for a double ear infection, ruptured ear drum on the right side, and a sinus infection with cefdinir, and antibiotic ear drops. She states that her shortness of breath has been going on since she was started on her antibiotics. She did have them changed from Levaquin due to that reason but the shortness of breath hasn't changed. She states that it feels like she can't get a good breath in. Denies use of birth control.

## 2023-12-23 NOTE — ED Provider Notes (Signed)
 [place] [facility] Provider Note   CSN: [id number] Arrival date & time: 12/23/23  1618     Patient presents with: Shortness of Breath   Patricia Dickerson is a 23 y.o. female otherwise healthy presents with complaints of shortness of breath that has been ongoing for the past 4 days.  Denies any chest pain.  No cough, fevers or chills.  She is currently being treated for a sinus and ear infection.  She has no rash or difficulty swallowing.    Shortness of Breath  Past Medical History:  Diagnosis Date   Vitamin D deficiency        Prior to Admission medications   Medication Sig Start Date End Date Taking? Authorizing Provider  busPIRone  (BUSPAR ) 7.5 MG tablet Take 1 tablet once daily at night, may take 1 add'l tab during the day as needed for anxiety. Patient not taking: Reported on 12/16/2021 10/04/18   Lavina Damien BRAVO, FNP  escitalopram  (LEXAPRO ) 10 MG tablet Take 1 tablet (10 mg total) by mouth daily. Take 1/2 tablet for first week Patient not taking: Reported on 05/14/2022 03/16/20   Kerrin Curly BIRCH, MD  hydrOXYzine  (ATARAX /VISTARIL ) 25 MG tablet Take 1 tablet (25 mg total) by mouth every 6 (six) hours. Patient not taking: Reported on 12/16/2021 04/10/21   Aberman, Caroline C, PA-C  ibuprofen  (ADVIL ) 800 MG tablet Take 1 tablet (800 mg total) by mouth every 8 (eight) hours as needed. Patient not taking: Reported on 08/19/2021 11/02/19   Copland, Alicia B, PA-C  medroxyPROGESTERone  (PROVERA ) 10 MG tablet Take 1 tablet (10 mg total) by mouth daily. 05/14/22   Dominic, Jinnie Jansky, CNM  omeprazole  (PRILOSEC) 40 MG capsule Take 1 capsule (40 mg total) by mouth daily. 03/16/20   Kerrin Curly BIRCH, MD    Allergies: Amoxicillin    Review of Systems  Respiratory:  Positive for shortness of breath.     Updated Vital Signs BP 130/82 (BP Location: Left Arm)   Pulse 91   Temp 98.1 F (36.7 C) (Oral)   Resp 18   Ht 6' 1 (1.854 m)    Wt (!) 208.7 kg   SpO2 99%   BMI 60.69 kg/m   Physical Exam Vitals and nursing note reviewed.  Constitutional:      General: She is not in acute distress.    Appearance: She is well-developed. She is obese.  HENT:     Head: Normocephalic and atraumatic.  Eyes:     Conjunctiva/sclera: Conjunctivae normal.  Cardiovascular:     Rate and Rhythm: Normal rate and regular rhythm.     Heart sounds: No murmur heard. Pulmonary:     Effort: Pulmonary effort is normal. No respiratory distress.     Breath sounds: Normal breath sounds.  Abdominal:     Palpations: Abdomen is soft.     Tenderness: There is no abdominal tenderness.  Musculoskeletal:        General: No swelling.     Cervical back: Neck supple.  Skin:    General: Skin is warm and dry.     Capillary Refill: Capillary refill takes less than 2 seconds.  Neurological:     Mental Status: She is alert.  Psychiatric:        Mood and Affect: Mood normal.     (all labs ordered are listed, but only abnormal results are displayed) Labs Reviewed  COMPREHENSIVE METABOLIC PANEL WITH GFR - Abnormal; Notable for the following components:  Result Value   Glucose, Bld 110 (*)    All other components within normal limits  SARS CORONAVIRUS 2 BY RT PCR  PREGNANCY, URINE  CBC WITH DIFFERENTIAL/PLATELET  D-DIMER, QUANTITATIVE    EKG: EKG Interpretation Date/Time:  Wednesday December 23 2023 16:27:32 EDT Ventricular Rate:  89 PR Interval:  134 QRS Duration:  94 QT Interval:  353 QTC Calculation: 430 R Axis:   54  Text Interpretation: Sinus rhythm Baseline wander in lead(s) V4 V5 No significant change since prior 11/22 Confirmed by Towana Sharper [phone]) on 12/23/2023 4:34:20 PM  Radiology: DG Chest 2 View Result Date: 12/23/2023 CLINICAL DATA:  shortness of breath EXAM: CHEST - 2 VIEW COMPARISON:  April 10, 2021 FINDINGS: No focal airspace consolidation, pleural effusion, or pneumothorax. No cardiomegaly. No acute fracture or  destructive lesion. IMPRESSION: No acute cardiopulmonary abnormality. Electronically Signed   By: Rogelia Myers M.D.   On: 12/23/2023 17:02     Procedures   Medications Ordered in the ED - No data to display                                  Medical Decision Making Amount and/or Complexity of Data Reviewed Labs: ordered. Radiology: ordered.   This patient presents to the ED with chief complaint(s) of shortness of breath.  The complaint involves an extensive differential diagnosis and also carries with it a high risk of complications and morbidity.   Pertinent past medical history as listed in HPI  The differential diagnosis includes  URI, pneumonia, PE, asthma, sinus congestion Additional history obtained: Records reviewed Care Everywhere/External Records  Assessment and management:   Hemodynamically stable, nontoxic-appearing female presenting with complaints of persistent shortness of breath over the past few days.  Lung sounds and chest x-ray are clear, she is additionally afebrile.  Do not suspect pneumonia.  She is not hypoxic or tachypneic.  She is not tachycardic.  She is PERC negative.  Although patient is particularly interested in ruling out a blood clot.  Will obtain a dimer.  Although she denies any heavy bleeding will add on routine labs to evaluate for any acute anemia.   Workup overall reassuring.  No clear etiology for patient's symptoms, however she is currently being treated for a sinus infection.  Her symptoms could very likely be in the setting of nasal congestion or postnasal drip.  Will discharge home with PCP follow-up.  Independent ECG interpretation:  Sinus rhythm  Independent labs interpretation:  The following labs were independently interpreted:  Pregnancy test negative, COVID-negative, dimer negative, CMP without significant abnormality, CBC unremarkable  Independent visualization and interpretation of imaging: I independently visualized the  following imaging with scope of interpretation limited to determining acute life threatening conditions related to emergency care:  Chest x-ray without acute cardiopulmonary disease   Consultations obtained:   None  Disposition:   Patient will be discharged home. The patient has been appropriately medically screened and/or stabilized in the ED. I have low suspicion for any other emergent medical condition which would require further screening, evaluation or treatment in the ED or require inpatient management. At time of discharge the patient is hemodynamically stable and in no acute distress. I have discussed work-up results and diagnosis with patient and answered all questions. Patient is agreeable with discharge plan. We discussed strict return precautions for returning to the emergency department and they verbalized understanding.     Social Determinants of  Health:   Patient's impaired access to primary care  increases the complexity of managing their presentation  This note was dictated with voice recognition software.  Despite best efforts at proofreading, errors may have occurred which can change the documentation meaning.       Final diagnoses:  Shortness of breath    ED Discharge Orders     None          Donnajean Lynwood VEAR DEVONNA 12/23/23 1855    Towana Ozell BROCKS, MD 12/24/23 [phone]

## 2023-12-23 NOTE — Discharge Instructions (Signed)
 You were evaluated in the emergency room for shortness of breath.  Your lab work and imaging did not show any significant abnormality.  If your symptoms persist please follow-up with your primary care doctor.  If you experience any new or worsening symptoms please return to emergency room.

## 2023-12-27 DIAGNOSIS — N898 Other specified noninflammatory disorders of vagina: Secondary | ICD-10-CM | POA: Diagnosis not present

## 2024-03-14 ENCOUNTER — Ambulatory Visit
Admission: EM | Admit: 2024-03-14 | Discharge: 2024-03-14 | Disposition: A | Discharge status: Home / Self Care | Attending: Emergency Medicine | Admitting: Emergency Medicine

## 2024-03-14 ENCOUNTER — Encounter: Payer: Self-pay | Admitting: Emergency Medicine

## 2024-03-14 DIAGNOSIS — B349 Viral infection, unspecified: Secondary | ICD-10-CM

## 2024-03-14 LAB — POC SOFIA SARS ANTIGEN FIA: SARS Coronavirus 2 Ag: NEGATIVE

## 2024-03-14 MED ORDER — BENZONATATE 100 MG PO CAPS: 100.0000 mg | ORAL_CAPSULE | Freq: Three times a day (TID) | ORAL | 0 refills | Status: AC

## 2024-03-14 MED ORDER — PREDNISONE 10 MG (21) PO TBPK: ORAL_TABLET | Freq: Every day | ORAL | 0 refills | Status: AC

## 2024-03-14 MED ORDER — AZITHROMYCIN 250 MG PO TABS: 250.0000 mg | ORAL_TABLET | Freq: Every day | ORAL | 0 refills | Status: AC

## 2024-03-14 MED ORDER — PROMETHAZINE-DM 6.25-15 MG/5ML PO SYRP: 5.0000 mL | ORAL_SOLUTION | Freq: Every evening | ORAL | 0 refills | Status: AC | PRN

## 2024-03-14 NOTE — Discharge Instructions (Signed)
 Your symptoms today are most likely being caused by a virus and should steadily improve in time it can take up to 7 to 10 days before you truly start to see a turnaround however things will get better, if no improvement seen by Saturday may pick up antibiotic from the pharmacy  Begin prednisone every morning to open and relax the airway, should help with wheezing and chest discomfort, avoid ibuprofen  while taking but may use Tylenol   Use Tessalon pill every 8 hours as needed for cough and cough syrup for bedtime    You can take Tylenol   as needed for fever reduction and pain relief.   For cough: honey 1/2 to 1 teaspoon (you can dilute the honey in water or another fluid).  You can also use guaifenesin and dextromethorphan for cough. You can use a humidifier for chest congestion and cough.  If you don't have a humidifier, you can sit in the bathroom with the hot shower running.      For sore throat: try warm salt water gargles, cepacol lozenges, throat spray, warm tea or water with lemon/honey, popsicles or ice, or OTC cold relief medicine for throat discomfort.   For congestion: take a daily anti-histamine like Zyrtec, Claritin, and a oral decongestant, such as pseudoephedrine.  You can also use Flonase  1-2 sprays in each nostril daily.   It is important to stay hydrated: drink plenty of fluids (water, gatorade/powerade/pedialyte, juices, or teas) to keep your throat moisturized and help further relieve irritation/discomfort.

## 2024-03-14 NOTE — ED Triage Notes (Signed)
 Patient reports chest congestion, headache, cough with green mucus and low grade fever x 4 days. Patient states it hurts to take a deep breath.

## 2024-03-14 NOTE — ED Provider Notes (Signed)
 Patricia Dickerson    CSN: [id number] Arrival date & time: 03/14/24  1738      History   Chief Complaint Chief Complaint  Patient presents with   Cough   Headache   Nasal Congestion   Fever    HPI Patricia Dickerson is a 23 y.o. female.    Patient presents for evaluation of a fever peaking at 99.6, occurring only at nighttime.  Nasal congestion, productive cough with green sputum, sore throat, nausea without vomiting and intermittent wheezing beginning 4 days ago.  Wheezing primarily at nighttime and in the morning.  Symptoms all worsening overnight.  Sore throat and nausea have resolved.  Tolerable to food and liquids.  Known sick contacts at work.  Has attempted use of over-the-counter cough and congestion medicine which has been somewhat helpful.  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 Authorizing Provider  azithromycin (ZITHROMAX) 250 MG tablet Take 1 tablet (250 mg total) by mouth daily. Take first 2 tablets together, then 1 every day until  finished. 03/19/24  Yes Hajira Verhagen R, NP  benzonatate (TESSALON) 100 MG capsule Take 1 capsule (100 mg total) by mouth every 8 (eight) hours. 03/14/24  Yes Deshanta Lady R, NP  predniSONE (STERAPRED UNI-PAK 21 TAB) 10 MG (21) TBPK tablet Take by mouth daily. Take 6 tabs by mouth daily  for 1 days, then 5 tabs for 1 days, then 4 tabs for 1 days, then 3 tabs for 1 days, 2 tabs for 1 days, then 1 tab by mouth daily for 1 days 03/14/24  Yes Wyvonne Carda R, NP  promethazine-dextromethorphan (PROMETHAZINE-DM) 6.25-15 MG/5ML syrup Take 5 mLs by mouth at bedtime as needed. 03/14/24  Yes Laddie Naeem R, NP  busPIRone  (BUSPAR ) 7.5 MG tablet Take 1 tablet once daily at night, may take 1 add'l tab during the day as needed for anxiety. Patient not taking: Reported on 12/16/2021 10/04/18   Lavina Damien BRAVO, FNP  escitalopram  (LEXAPRO ) 10 MG tablet Take 1 tablet (10 mg total) by mouth daily. Take 1/2 tablet for first week Patient not taking: Reported on 05/14/2022 03/16/20   Kerrin Curly BIRCH, MD  hydrOXYzine  (ATARAX /VISTARIL ) 25 MG tablet Take 1 tablet (25 mg total) by mouth every 6 (six) hours. Patient not taking: Reported  on 12/16/2021 04/10/21   Aberman, Caroline C, PA-C  ibuprofen  (ADVIL ) 800 MG tablet Take 1 tablet (800 mg total) by mouth every 8 (eight) hours as needed. Patient not taking: Reported on 08/19/2021 11/02/19   Copland, Alicia B, PA-C  medroxyPROGESTERone  (PROVERA ) 10 MG tablet Take 1 tablet (10 mg total) by mouth daily. 05/14/22   Dominic, Jinnie Jansky, CNM  omeprazole  (PRILOSEC) 40 MG capsule Take 1 capsule (40 mg total) by mouth daily. 03/16/20   Kerrin Curly BIRCH, MD    Family History Family History  Problem Relation Age of Onset   Thyroid  disease Mother    Diabetes Father    Breast cancer Paternal Aunt        23s   Breast cancer Paternal Grandmother        not sure of age    Social History Social History   Tobacco Use   Smoking status: Never   Smokeless  tobacco: Never  Vaping Use   Vaping status: Never Used  Substance Use Topics   Alcohol use: Not Currently   Drug use: Never     Allergies   Amoxicillin   Review of Systems Review of Systems   Physical Exam Triage Vital Signs ED Triage Vitals  Encounter Vitals Group     BP 03/14/24 1813 123/77     Girls Systolic BP Percentile --      Girls Diastolic BP Percentile --      Boys Systolic BP Percentile --      Boys Diastolic BP Percentile --      Pulse Rate 03/14/24 1813 71     Resp 03/14/24 1813 18     Temp 03/14/24 1813 98.3 F (36.8 C)     Temp Source 03/14/24 1813 Oral     SpO2 03/14/24 1813 99 %     Weight --      Height --      Head Circumference --      Peak Flow --      Pain Score 03/14/24 1810 5     Pain Loc --      Pain Education --      Exclude from Growth Chart --    No data found.  Updated Vital Signs BP 123/77 (BP Location: Left Arm)   Pulse 71   Temp 98.3 F (36.8 C) (Oral)   Resp 18   LMP 03/11/2024 (Exact Date)   SpO2 99%   Visual Acuity Right Eye Distance:   Left Eye Distance:   Bilateral Distance:    Right Eye Near:   Left Eye Near:    Bilateral Near:     Physical Exam Constitutional:      Appearance: Normal appearance.  HENT:     Head: Normocephalic.     Right Ear: Tympanic membrane, ear canal and external ear normal.     Left Ear: Tympanic membrane, ear canal and external ear normal.     Nose: Congestion present.     Mouth/Throat:     Mouth: Mucous membranes are moist.     Pharynx: Oropharynx is clear.  Eyes:     Extraocular Movements: Extraocular movements intact.  Cardiovascular:     Rate and Rhythm: Normal rate and regular rhythm.     Pulses: Normal pulses.     Heart sounds: Normal heart sounds.  Pulmonary:     Effort: Pulmonary effort is normal.     Breath sounds: Normal breath sounds.  Musculoskeletal:     Cervical back: Normal range  of motion and neck supple.  Neurological:     Mental Status: She is alert and  oriented to person, place, and time. Mental status is at baseline.      UC Treatments / Results  Labs (all labs ordered are listed, but only abnormal results are displayed) Labs Reviewed  POC SOFIA SARS ANTIGEN FIA    EKG   Radiology No results found.  Procedures Procedures (including critical care time)  Medications Ordered in UC Medications - No data to display  Initial Impression / Assessment and Plan / UC Course  I have reviewed the triage vital signs and the nursing notes.  Pertinent labs & imaging results that were available during my care of the patient were reviewed by me and considered in my medical decision making (see chart for details).  Viral illness  Patient is in no signs of distress nor toxic appearing.  Vital signs are stable.  Low suspicion for pneumonia, pneumothorax or bronchitis and therefore will defer imaging.  COVID test negative.  Prescribed prednisone, Tessalon and Promethazine DM, watch and wait antibiotic placed at pharmacy.May use additional over-the-counter medications as needed for supportive care.  May follow-up with urgent care as needed if symptoms persist or worsen.  Final Clinical Impressions(s) / UC Diagnoses   Final diagnoses:  Viral illness     Discharge Instructions      Your symptoms today are most likely being caused by a virus and should steadily improve in time it can take up to 7 to 10 days before you truly start to see a turnaround however things will get better, if no improvement seen by Saturday may pick up antibiotic from the pharmacy  Begin prednisone every morning to open and relax the airway, should help with wheezing and chest discomfort, avoid ibuprofen  while taking but may use Tylenol   Use Tessalon pill every 8 hours as needed for cough and cough syrup for bedtime    You can take Tylenol   as needed for fever reduction and pain relief.   For cough: honey 1/2 to 1 teaspoon (you can dilute the honey in water or  another fluid).  You can also use guaifenesin and dextromethorphan for cough. You can use a humidifier for chest congestion and cough.  If you don't have a humidifier, you can sit in the bathroom with the hot shower running.      For sore throat: try warm salt water gargles, cepacol lozenges, throat spray, warm tea or water with lemon/honey, popsicles or ice, or OTC cold relief medicine for throat discomfort.   For congestion: take a daily anti-histamine like Zyrtec, Claritin, and a oral decongestant, such as pseudoephedrine.  You can also use Flonase  1-2 sprays in each nostril daily.   It is important to stay hydrated: drink plenty of fluids (water, gatorade/powerade/pedialyte, juices, or teas) to keep your throat moisturized and help further relieve irritation/discomfort.    ED Prescriptions     Medication Sig Dispense Auth. Provider   predniSONE (STERAPRED UNI-PAK 21 TAB) 10 MG (21) TBPK tablet Take by mouth daily. Take 6 tabs by mouth daily  for 1 days, then 5 tabs for 1 days, then 4 tabs for 1 days, then 3 tabs for 1 days, 2 tabs for 1 days, then 1 tab by mouth daily for 1 days 21 tablet Darrow Barreiro R, NP   benzonatate (TESSALON) 100 MG capsule Take 1 capsule (100 mg total) by mouth every 8 (eight) hours. 21 capsule Jordon Bourquin R, NP  promethazine-dextromethorphan (PROMETHAZINE-DM) 6.25-15 MG/5ML syrup Take 5 mLs by mouth at bedtime as needed. 118 mL Latrease Kunde R, NP   azithromycin (ZITHROMAX) 250 MG tablet Take 1 tablet (250 mg total) by mouth daily. Take first 2 tablets together, then 1 every day until finished. 6 tablet Massie Mees R, NP      PDMP not reviewed this encounter.   Teresa Shelba SAUNDERS, NP 03/14/24 [phone]
